# Patient Record
Sex: Female | Born: 1965
Health system: Southern US, Community
[De-identification: ages and names within clinical notes are randomized; demographics above are authoritative.]

## PROBLEM LIST (undated history)

## (undated) DIAGNOSIS — S62309A Unspecified fracture of unspecified metacarpal bone, initial encounter for closed fracture: Secondary | ICD-10-CM

## (undated) DIAGNOSIS — D509 Iron deficiency anemia, unspecified: Secondary | ICD-10-CM

## (undated) DIAGNOSIS — J019 Acute sinusitis, unspecified: Secondary | ICD-10-CM

## (undated) DIAGNOSIS — E785 Hyperlipidemia, unspecified: Secondary | ICD-10-CM

## (undated) DIAGNOSIS — S329XXA Fracture of unspecified parts of lumbosacral spine and pelvis, initial encounter for closed fracture: Secondary | ICD-10-CM

## (undated) HISTORY — DX: Hyperlipidemia, unspecified: E78.5

## (undated) HISTORY — DX: Acute sinusitis, unspecified: J01.90

## (undated) HISTORY — DX: Fracture of unspecified parts of lumbosacral spine and pelvis, initial encounter for closed fracture: S32.9XXA

## (undated) HISTORY — DX: Unspecified fracture of unspecified metacarpal bone, initial encounter for closed fracture: S62.309A

## (undated) HISTORY — DX: Iron deficiency anemia, unspecified: D50.9

## (undated) HISTORY — PX: OTHER SURGICAL HISTORY: SHX169

---

## 1999-03-26 ENCOUNTER — Encounter: Payer: Self-pay | Admitting: Obstetrics & Gynecology

## 1999-03-26 ENCOUNTER — Ambulatory Visit (HOSPITAL_COMMUNITY): Admission: RE | Admit: 1999-03-26 | Discharge: 1999-03-26 | Payer: Self-pay | Admitting: Obstetrics & Gynecology

## 1999-06-19 ENCOUNTER — Ambulatory Visit (HOSPITAL_COMMUNITY): Admission: RE | Admit: 1999-06-19 | Discharge: 1999-06-19 | Payer: Self-pay | Admitting: Ophthalmology

## 1999-06-19 ENCOUNTER — Encounter: Payer: Self-pay | Admitting: Ophthalmology

## 2000-04-19 ENCOUNTER — Encounter: Admission: RE | Admit: 2000-04-19 | Discharge: 2000-04-19 | Payer: Self-pay | Admitting: Obstetrics & Gynecology

## 2000-04-19 ENCOUNTER — Encounter: Payer: Self-pay | Admitting: Obstetrics & Gynecology

## 2001-03-16 ENCOUNTER — Other Ambulatory Visit: Admission: RE | Admit: 2001-03-16 | Discharge: 2001-03-16 | Payer: Self-pay | Admitting: Obstetrics and Gynecology

## 2002-06-13 ENCOUNTER — Other Ambulatory Visit: Admission: RE | Admit: 2002-06-13 | Discharge: 2002-06-13 | Payer: Self-pay | Admitting: Obstetrics and Gynecology

## 2002-09-14 ENCOUNTER — Emergency Department (HOSPITAL_COMMUNITY): Admission: EM | Admit: 2002-09-14 | Discharge: 2002-09-14 | Payer: Self-pay | Admitting: Emergency Medicine

## 2003-11-14 ENCOUNTER — Other Ambulatory Visit: Admission: RE | Admit: 2003-11-14 | Discharge: 2003-11-14 | Payer: Self-pay | Admitting: Obstetrics and Gynecology

## 2004-12-14 ENCOUNTER — Other Ambulatory Visit: Admission: RE | Admit: 2004-12-14 | Discharge: 2004-12-14 | Payer: Self-pay | Admitting: Obstetrics and Gynecology

## 2004-12-18 ENCOUNTER — Encounter: Admission: RE | Admit: 2004-12-18 | Discharge: 2004-12-18 | Payer: Self-pay | Admitting: Obstetrics and Gynecology

## 2005-10-20 ENCOUNTER — Emergency Department (HOSPITAL_COMMUNITY): Admission: EM | Admit: 2005-10-20 | Discharge: 2005-10-20 | Payer: Self-pay | Admitting: Emergency Medicine

## 2006-05-05 ENCOUNTER — Encounter: Admission: RE | Admit: 2006-05-05 | Discharge: 2006-05-05 | Payer: Self-pay | Admitting: Obstetrics and Gynecology

## 2006-05-19 ENCOUNTER — Encounter: Admission: RE | Admit: 2006-05-19 | Discharge: 2006-05-19 | Payer: Self-pay | Admitting: Obstetrics and Gynecology
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
## 2007-05-25 ENCOUNTER — Encounter: Admission: RE | Admit: 2007-05-25 | Discharge: 2007-05-25 | Payer: Self-pay | Admitting: Obstetrics and Gynecology

## 2007-11-29 ENCOUNTER — Ambulatory Visit: Payer: Self-pay | Admitting: Internal Medicine

## 2007-11-29 DIAGNOSIS — S329XXA Fracture of unspecified parts of lumbosacral spine and pelvis, initial encounter for closed fracture: Secondary | ICD-10-CM

## 2007-11-29 DIAGNOSIS — S62309A Unspecified fracture of unspecified metacarpal bone, initial encounter for closed fracture: Secondary | ICD-10-CM

## 2007-11-29 DIAGNOSIS — D509 Iron deficiency anemia, unspecified: Secondary | ICD-10-CM

## 2007-11-29 HISTORY — DX: Iron deficiency anemia, unspecified: D50.9

## 2007-11-29 HISTORY — DX: Unspecified fracture of unspecified metacarpal bone, initial encounter for closed fracture: S62.309A

## 2007-11-29 HISTORY — DX: Fracture of unspecified parts of lumbosacral spine and pelvis, initial encounter for closed fracture: S32.9XXA

## 2007-11-29 LAB — CONVERTED CEMR LAB
AST: 27 units/L (ref 0–37)
Albumin: 3.8 g/dL (ref 3.5–5.2)
Alkaline Phosphatase: 34 units/L — ABNORMAL LOW (ref 39–117)
Basophils Relative: 1.3 % — ABNORMAL HIGH (ref 0.0–1.0)
Bilirubin Urine: NEGATIVE
Bilirubin, Direct: 0.1 mg/dL (ref 0.0–0.3)
CO2: 27 meq/L (ref 19–32)
Calcium: 9.2 mg/dL (ref 8.4–10.5)
Chloride: 104 meq/L (ref 96–112)
Cholesterol: 248 mg/dL (ref 0–200)
Crystals: NEGATIVE
Eosinophils Absolute: 0.5 10*3/uL (ref 0.0–0.7)
Eosinophils Relative: 9.6 % — ABNORMAL HIGH (ref 0.0–5.0)
GFR calc non Af Amer: 64 mL/min
Glucose, Bld: 95 mg/dL (ref 70–99)
HCT: 33.6 % — ABNORMAL LOW (ref 36.0–46.0)
Hemoglobin: 10.8 g/dL — ABNORMAL LOW (ref 12.0–15.0)
MCV: 69.4 fL — ABNORMAL LOW (ref 78.0–100.0)
Monocytes Absolute: 0.3 10*3/uL (ref 0.1–1.0)
Monocytes Relative: 6.8 % (ref 3.0–12.0)
Mucus, UA: NEGATIVE
Nitrite: NEGATIVE
Platelets: 273 10*3/uL (ref 150–400)
RBC: 4.84 M/uL (ref 3.87–5.11)
Specific Gravity, Urine: <=1.005 (ref 1.000–1.03)
Squamous Epithelial / LPF: NEGATIVE /lpf
TSH: 0.8 microintl units/mL (ref 0.35–5.50)
Total Protein, Urine: NEGATIVE mg/dL
Triglycerides: 57 mg/dL (ref 0–149)
Urobilinogen, UA: 0.2 (ref 0.0–1.0)
pH: 6 (ref 5.0–8.0)

## 2008-02-13 ENCOUNTER — Telehealth (INDEPENDENT_AMBULATORY_CARE_PROVIDER_SITE_OTHER): Payer: Self-pay | Admitting: *Deleted

## 2008-02-14 ENCOUNTER — Encounter (INDEPENDENT_AMBULATORY_CARE_PROVIDER_SITE_OTHER): Payer: Self-pay | Admitting: *Deleted

## 2008-05-28 ENCOUNTER — Encounter: Admission: RE | Admit: 2008-05-28 | Discharge: 2008-05-28 | Payer: Self-pay | Admitting: Obstetrics and Gynecology

## 2008-10-12 ENCOUNTER — Ambulatory Visit: Payer: Self-pay | Admitting: Internal Medicine

## 2008-10-12 DIAGNOSIS — J019 Acute sinusitis, unspecified: Secondary | ICD-10-CM

## 2008-10-12 HISTORY — DX: Acute sinusitis, unspecified: J01.90

## 2008-12-17 ENCOUNTER — Ambulatory Visit: Payer: Self-pay | Admitting: Internal Medicine

## 2008-12-17 LAB — CONVERTED CEMR LAB
ALT: 16 units/L (ref 0–35)
AST: 23 units/L (ref 0–37)
Albumin: 4.1 g/dL (ref 3.5–5.2)
Alkaline Phosphatase: 39 units/L (ref 39–117)
BUN: 17 mg/dL (ref 6–23)
Basophils Absolute: 0.1 10*3/uL (ref 0.0–0.1)
Basophils Relative: 1.5 % (ref 0.0–3.0)
Bilirubin Urine: NEGATIVE
Bilirubin, Direct: 0 mg/dL (ref 0.0–0.3)
CO2: 30 meq/L (ref 19–32)
Calcium: 9.2 mg/dL (ref 8.4–10.5)
Chloride: 104 meq/L (ref 96–112)
Cholesterol: 229 mg/dL — ABNORMAL HIGH (ref 0–200)
Creatinine, Ser: 0.9 mg/dL (ref 0.4–1.2)
Direct LDL: 149.8 mg/dL
Eosinophils Absolute: 0.5 10*3/uL (ref 0.0–0.7)
Eosinophils Relative: 9.1 % — ABNORMAL HIGH (ref 0.0–5.0)
GFR calc non Af Amer: 87.15 mL/min (ref >60)
Glucose, Bld: 85 mg/dL (ref 70–99)
HCT: 33.5 % — ABNORMAL LOW (ref 36.0–46.0)
HDL: 68.1 mg/dL (ref >39.00)
Hemoglobin: 11.1 g/dL — ABNORMAL LOW (ref 12.0–15.0)
Ketones, ur: NEGATIVE mg/dL
Lymphocytes Relative: 30.2 % (ref 12.0–46.0)
Lymphs Abs: 1.5 10*3/uL (ref 0.7–4.0)
MCHC: 33 g/dL (ref 30.0–36.0)
MCV: 69.1 fL — ABNORMAL LOW (ref 78.0–100.0)
Monocytes Absolute: 0.3 10*3/uL (ref 0.1–1.0)
Monocytes Relative: 6.6 % (ref 3.0–12.0)
Neutro Abs: 2.7 10*3/uL (ref 1.4–7.7)
Neutrophils Relative %: 52.6 % (ref 43.0–77.0)
Nitrite: NEGATIVE
Platelets: 320 10*3/uL (ref 150.0–400.0)
Potassium: 4.3 meq/L (ref 3.5–5.1)
RBC: 4.85 M/uL (ref 3.87–5.11)
RDW: 15.2 % — ABNORMAL HIGH (ref 11.5–14.6)
Sodium: 141 meq/L (ref 135–145)
Specific Gravity, Urine: <=1.005 (ref 1.000–1.030)
TSH: 0.95 microintl units/mL (ref 0.35–5.50)
Total Bilirubin: 0.7 mg/dL (ref 0.3–1.2)
Total CHOL/HDL Ratio: 3
Total Protein, Urine: NEGATIVE mg/dL
Total Protein: 7.6 g/dL (ref 6.0–8.3)
Triglycerides: 62 mg/dL (ref 0.0–149.0)
Urine Glucose: NEGATIVE mg/dL
Urobilinogen, UA: 0.2 (ref 0.0–1.0)
VLDL: 12.4 mg/dL (ref 0.0–40.0)
WBC: 5.1 10*3/uL (ref 4.5–10.5)
pH: 5.5 (ref 5.0–8.0)

## 2009-05-01 LAB — HM MAMMOGRAPHY: HM Mammogram: NORMAL

## 2009-06-27 ENCOUNTER — Encounter: Admission: RE | Admit: 2009-06-27 | Discharge: 2009-06-27 | Payer: Self-pay | Admitting: Obstetrics and Gynecology

## 2010-01-06 ENCOUNTER — Encounter: Payer: Self-pay | Admitting: Internal Medicine

## 2010-01-06 ENCOUNTER — Ambulatory Visit: Payer: Self-pay | Admitting: Internal Medicine

## 2010-01-06 DIAGNOSIS — E785 Hyperlipidemia, unspecified: Secondary | ICD-10-CM

## 2010-01-06 HISTORY — DX: Hyperlipidemia, unspecified: E78.5

## 2010-01-07 LAB — CONVERTED CEMR LAB
Albumin: 4 g/dL (ref 3.5–5.2)
BUN: 17 mg/dL (ref 6–23)
Basophils Absolute: 0.1 10*3/uL (ref 0.0–0.1)
CO2: 27 meq/L (ref 19–32)
Calcium: 9.2 mg/dL (ref 8.4–10.5)
Cholesterol: 270 mg/dL — ABNORMAL HIGH (ref 0–200)
Eosinophils Absolute: 0.5 10*3/uL (ref 0.0–0.7)
Folate: >20 ng/mL
GFR calc non Af Amer: 82.49 mL/min (ref >60)
Glucose, Bld: 84 mg/dL (ref 70–99)
HCT: 34.3 % — ABNORMAL LOW (ref 36.0–46.0)
HDL: 76.4 mg/dL (ref >39.00)
Hemoglobin: 11.3 g/dL — ABNORMAL LOW (ref 12.0–15.0)
Lymphocytes Relative: 25.3 % (ref 12.0–46.0)
Lymphs Abs: 1.1 10*3/uL (ref 0.7–4.0)
MCHC: 32.9 g/dL (ref 30.0–36.0)
Monocytes Relative: 7.6 % (ref 3.0–12.0)
Neutro Abs: 2.2 10*3/uL (ref 1.4–7.7)
Nitrite: NEGATIVE
Platelets: 322 10*3/uL (ref 150.0–400.0)
RDW: 16.9 % — ABNORMAL HIGH (ref 11.5–14.6)
Sodium: 142 meq/L (ref 135–145)
Specific Gravity, Urine: 1.025 (ref 1.000–1.030)
Total Bilirubin: 0.6 mg/dL (ref 0.3–1.2)
Total CHOL/HDL Ratio: 4
Total Protein, Urine: NEGATIVE mg/dL
VLDL: 7.8 mg/dL (ref 0.0–40.0)
pH: 5.5 (ref 5.0–8.0)

## 2010-01-17 ENCOUNTER — Ambulatory Visit: Payer: Self-pay | Admitting: Internal Medicine

## 2010-01-17 LAB — CONVERTED CEMR LAB
Bilirubin Urine: NEGATIVE
Hemoglobin, Urine: NEGATIVE
Ketones, ur: NEGATIVE mg/dL
Total Protein, Urine: NEGATIVE mg/dL
Urine Glucose: NEGATIVE mg/dL

## 2010-06-17 ENCOUNTER — Ambulatory Visit
Admission: RE | Admit: 2010-06-17 | Discharge: 2010-06-17 | Payer: Self-pay | Source: Home / Self Care | Attending: Internal Medicine | Admitting: Internal Medicine

## 2010-06-17 DIAGNOSIS — N390 Urinary tract infection, site not specified: Secondary | ICD-10-CM | POA: Insufficient documentation

## 2010-06-17 LAB — CONVERTED CEMR LAB
Bilirubin Urine: NEGATIVE
Blood in Urine, dipstick: NEGATIVE
Nitrite: NEGATIVE
Protein, U semiquant: NEGATIVE
WBC Urine, dipstick: NEGATIVE

## 2010-06-18 ENCOUNTER — Encounter: Payer: Self-pay | Admitting: Internal Medicine

## 2010-06-22 ENCOUNTER — Encounter: Payer: Self-pay | Admitting: Obstetrics and Gynecology
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
## 2010-07-29 ENCOUNTER — Other Ambulatory Visit: Payer: Self-pay | Admitting: Obstetrics and Gynecology

## 2010-07-29 DIAGNOSIS — Z1231 Encounter for screening mammogram for malignant neoplasm of breast: Secondary | ICD-10-CM

## 2010-08-05 ENCOUNTER — Ambulatory Visit
Admission: RE | Admit: 2010-08-05 | Discharge: 2010-08-05 | Disposition: A | Discharge status: Home / Self Care | Payer: Commercial Managed Care - PPO | Source: Ambulatory Visit | Attending: Obstetrics and Gynecology | Admitting: Obstetrics and Gynecology

## 2010-08-05 DIAGNOSIS — Z1231 Encounter for screening mammogram for malignant neoplasm of breast: Secondary | ICD-10-CM

## 2011-04-02 ENCOUNTER — Encounter: Payer: Self-pay | Admitting: Internal Medicine

## 2011-04-03 ENCOUNTER — Encounter: Payer: Self-pay | Admitting: Internal Medicine

## 2011-04-03 DIAGNOSIS — Z0000 Encounter for general adult medical examination without abnormal findings: Secondary | ICD-10-CM | POA: Insufficient documentation

## 2011-04-10 ENCOUNTER — Encounter: Payer: Self-pay | Admitting: Internal Medicine

## 2011-04-10 ENCOUNTER — Other Ambulatory Visit (INDEPENDENT_AMBULATORY_CARE_PROVIDER_SITE_OTHER): Payer: Commercial Managed Care - PPO

## 2011-04-10 ENCOUNTER — Ambulatory Visit (INDEPENDENT_AMBULATORY_CARE_PROVIDER_SITE_OTHER): Payer: Commercial Managed Care - PPO | Admitting: Internal Medicine

## 2011-04-10 VITALS — BP 118/72 | HR 71 | Temp 98.1°F | Ht 71.0 in | Wt 182.0 lb

## 2011-04-10 DIAGNOSIS — Z0000 Encounter for general adult medical examination without abnormal findings: Secondary | ICD-10-CM

## 2011-04-10 DIAGNOSIS — M25562 Pain in left knee: Secondary | ICD-10-CM

## 2011-04-10 DIAGNOSIS — D509 Iron deficiency anemia, unspecified: Secondary | ICD-10-CM

## 2011-04-10 DIAGNOSIS — M1712 Unilateral primary osteoarthritis, left knee: Secondary | ICD-10-CM | POA: Insufficient documentation

## 2011-04-10 DIAGNOSIS — E785 Hyperlipidemia, unspecified: Secondary | ICD-10-CM

## 2011-04-10 DIAGNOSIS — M25569 Pain in unspecified knee: Secondary | ICD-10-CM

## 2011-04-10 LAB — URINALYSIS, ROUTINE W REFLEX MICROSCOPIC
Bilirubin Urine: NEGATIVE
Leukocytes, UA: NEGATIVE
Nitrite: NEGATIVE
Specific Gravity, Urine: 1.01 (ref 1.000–1.030)
pH: 6 (ref 5.0–8.0)

## 2011-04-10 LAB — CBC WITH DIFFERENTIAL/PLATELET
Eosinophils Relative: 8.9 % — ABNORMAL HIGH (ref 0.0–5.0)
Monocytes Absolute: 0.5 10*3/uL (ref 0.1–1.0)
Monocytes Relative: 7.4 % (ref 3.0–12.0)
Neutrophils Relative %: 55.5 % (ref 43.0–77.0)
Platelets: 355 10*3/uL (ref 150.0–400.0)
WBC: 6.2 10*3/uL (ref 4.5–10.5)

## 2011-04-10 LAB — HEPATIC FUNCTION PANEL
ALT: 18 U/L (ref 0–35)
AST: 23 U/L (ref 0–37)
Albumin: 3.8 g/dL (ref 3.5–5.2)

## 2011-04-10 LAB — LIPID PANEL
Cholesterol: 186 mg/dL (ref 0–200)
VLDL: 14.8 mg/dL (ref 0.0–40.0)

## 2011-04-10 LAB — BASIC METABOLIC PANEL
BUN: 15 mg/dL (ref 6–23)
Chloride: 103 mEq/L (ref 96–112)
Glucose, Bld: 81 mg/dL (ref 70–99)
Potassium: 3.8 mEq/L (ref 3.5–5.1)

## 2011-04-10 LAB — TSH: TSH: 0.73 u[IU]/mL (ref 0.35–5.50)

## 2011-04-10 NOTE — Patient Instructions (Signed)
Continue all other medications as before Please go to LAB in the Basement for the blood and/or urine tests to be done today Please call the phone number [phone] (the [employer] System) for results of testing in 2-3 days;  When calling, simply dial the number, and when prompted enter the MRN number above (the Medical Record Number) and the # key, then the message should start. Please call if you change your mind about medication for the high cholesterol Please return in 1 year for your yearly visit, or sooner if needed, with Lab testing done 3-5 days before

## 2011-04-11 ENCOUNTER — Encounter: Payer: Self-pay | Admitting: Internal Medicine

## 2011-04-11 NOTE — Assessment & Plan Note (Signed)
?   Mild cartilage tear;  Overall mild, decliens ortho eval at this time, tylenol prn

## 2011-04-11 NOTE — Assessment & Plan Note (Addendum)
With persistent elevation as per family - high lilkely genetic but essent neg FH for stroke/MI per pt; I suggested statin tx but she declines  Lab Results  Component Value Date   CHOL 270* 01/06/2010   HDL 76.40 01/06/2010   LDLDIRECT 181.9 01/06/2010   TRIG 39.0 01/06/2010   CHOLHDL 4 01/06/2010

## 2011-04-11 NOTE — Assessment & Plan Note (Signed)
stable overall by hx and exam, most recent data reviewed with pt, and pt to continue medical treatment as before , f/u labs next visit

## 2011-04-11 NOTE — Progress Notes (Signed)
Subjective:    Patient ID: Ruth Medina, female    DOB: Jul 16, 1965, 45 y.o.   MRN: [id number]  HPI Here for wellness and f/u;  Overall doing ok;  Pt denies CP, worsening SOB, DOE, wheezing, orthopnea, PND, worsening LE edema, palpitations, dizziness or syncope.  Pt denies neurological change such as new Headache, facial or extremity weakness.  Pt denies polydipsia, polyuria, or low sugar symptoms. Pt states overall good compliance with treatment and medications, good tolerability, and trying to follow lower cholesterol diet.  Pt denies worsening depressive symptoms, suicidal ideation or panic. No fever, wt loss, night sweats, loss of appetite, or other constitutional symptoms.  Pt states good ability with ADL's, low fall risk, home safety reviewed and adequate, no significant changes in hearing or vision, and occasionally active with exercise. Does have recurreing  1 yr left knee medial effusion and discomfort, worse to walking up inclines only, after episodes of exercise at the gym. No giveaway or falls Past Medical History  Diagnosis Date  . 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 FRACTURE, HAND, LEFT 11/29/2007  . FRACTURE, PELVIS 11/29/2007  . HYPERLIPIDEMIA 01/06/2010  . SINUSITIS- ACUTE-NOS 10/12/2008   Past Surgical History  Procedure Date  . Left hand surgery     Dr. Eulah Pont    reports that she has never smoked. She does not have any smokeless tobacco history on file. She reports that she drinks alcohol. She reports that she does not use illicit drugs. family history includes Alcohol abuse in her father; Breast cancer in her mother; and Hypertension in her father and mother. No Known Allergies Current Outpatient Prescriptions on File Prior to Visit  Medication Sig Dispense Refill  . ferrous sulfate 325 (65 FE) MG tablet Take 325 mg by mouth daily with breakfast.        . Multiple Vitamin (MULTIVITAMIN) tablet Take 1 tablet by mouth daily.        . fluconazole (DIFLUCAN) 100 MG tablet Take  100 mg by mouth daily.         Review of Systems Review of Systems  Constitutional: Negative for diaphoresis, activity change, appetite change and unexpected weight change.  HENT: Negative for hearing loss, ear pain, facial swelling, mouth sores and neck stiffness.   Eyes: Negative for pain, redness and visual disturbance.  Respiratory: Negative for shortness of breath and wheezing.   Cardiovascular: Negative for chest pain and palpitations.  Gastrointestinal: Negative for diarrhea, blood in stool, abdominal distention and rectal pain.  Genitourinary: Negative for hematuria, flank pain and decreased urine volume.  Musculoskeletal: Negative for myalgias and joint swelling.  Skin: Negative for color change and wound.  Neurological: Negative for syncope and numbness.  Hematological: Negative for adenopathy.  Psychiatric/Behavioral: Negative for hallucinations, self-injury, decreased concentration and agitation.      Objective:   Physical Exam BP 118/72  Pulse 71  Temp(Src) 98.1 F (36.7 C) (Oral)  Ht 5\' 11"  (1.803 m)  Wt 182 lb (82.555 kg)  BMI 25.38 kg/m2  SpO2 95%  LMP 04/04/2011 Physical Exam  VS noted Constitutional: Pt is oriented to person, place, and time. Appears well-developed and well-nourished.  HENT:  Head: Normocephalic and atraumatic.  Right Ear: External ear normal.  Left Ear: External ear normal.  Nose: Nose normal.  Mouth/Throat: Oropharynx is clear and moist.  Eyes: Conjunctivae and EOM are normal. Pupils are equal, round, and reactive to light.  Neck: Normal range of motion. Neck supple. No JVD present. No tracheal deviation present.  Cardiovascular: Normal  rate, regular rhythm, normal heart sounds and intact distal pulses.   Pulmonary/Chest: Effort normal and breath sounds normal.  Abdominal: Soft. Bowel sounds are normal. There is no tenderness.  Musculoskeletal: Normal range of motion. Exhibits no edema.  Lymphadenopathy:  Has no cervical adenopathy.    Neurological: Pt is alert and oriented to person, place, and time. Pt has normal reflexes. No cranial nerve deficit.  Skin: Skin is warm and dry. No rash noted.  Psychiatric:  Has  normal mood and affect. Behavior is normal.  Left knee with mild anteromedial effusion, mild tender, neg lochmans    Assessment & Plan:

## 2011-04-14 LAB — IBC PANEL
Iron: 32 ug/dL — ABNORMAL LOW (ref 42–145)
Saturation Ratios: 8.2 % — ABNORMAL LOW (ref 20.0–50.0)

## 2011-06-16 ENCOUNTER — Ambulatory Visit: Payer: Commercial Managed Care - PPO | Admitting: Internal Medicine

## 2011-08-13 ENCOUNTER — Encounter (INDEPENDENT_AMBULATORY_CARE_PROVIDER_SITE_OTHER): Payer: Commercial Managed Care - PPO | Admitting: Obstetrics and Gynecology

## 2011-08-13 ENCOUNTER — Encounter (INDEPENDENT_AMBULATORY_CARE_PROVIDER_SITE_OTHER): Payer: Commercial Managed Care - PPO

## 2011-08-13 DIAGNOSIS — N949 Unspecified condition associated with female genital organs and menstrual cycle: Secondary | ICD-10-CM

## 2011-10-14 ENCOUNTER — Other Ambulatory Visit: Payer: Self-pay | Admitting: Obstetrics and Gynecology

## 2011-10-14 DIAGNOSIS — Z1231 Encounter for screening mammogram for malignant neoplasm of breast: Secondary | ICD-10-CM

## 2011-10-19 ENCOUNTER — Ambulatory Visit
Admission: RE | Admit: 2011-10-19 | Discharge: 2011-10-19 | Disposition: A | Discharge status: Home / Self Care | Payer: Commercial Managed Care - PPO | Source: Ambulatory Visit | Attending: Obstetrics and Gynecology | Admitting: Obstetrics and Gynecology

## 2011-10-19 DIAGNOSIS — Z1231 Encounter for screening mammogram for malignant neoplasm of breast: Secondary | ICD-10-CM

## 2011-12-22 ENCOUNTER — Encounter: Payer: Self-pay | Admitting: Obstetrics and Gynecology

## 2012-01-21 ENCOUNTER — Telehealth: Payer: Self-pay | Admitting: Internal Medicine

## 2012-01-21 NOTE — Telephone Encounter (Signed)
LMP: 01/07/12 Caller: Salley Slaughter; Patient Name: Ruth Medina; PCP: Oliver Barre; Best Callback Phone Number: [phone].Jendayi has been feeling stressed with changes with job- when she goes home symptoms start. She feels anxious and  is having trouble sleeping. Triage and Care advice per Anxiety Protocol and appnt advised within 24 hours for "feeling overwhelmed and unable to calm down". Scheduled for 01/22/12  @ 1045 with Dr. Jonny Ruiz.

## 2012-01-22 ENCOUNTER — Ambulatory Visit (INDEPENDENT_AMBULATORY_CARE_PROVIDER_SITE_OTHER): Payer: Commercial Managed Care - PPO | Admitting: Internal Medicine

## 2012-01-22 ENCOUNTER — Encounter: Payer: Self-pay | Admitting: Internal Medicine

## 2012-01-22 VITALS — BP 132/90 | HR 88 | Temp 97.5°F | Ht 71.0 in | Wt 180.1 lb

## 2012-01-22 DIAGNOSIS — F419 Anxiety disorder, unspecified: Secondary | ICD-10-CM | POA: Insufficient documentation

## 2012-01-22 DIAGNOSIS — F411 Generalized anxiety disorder: Secondary | ICD-10-CM

## 2012-01-22 MED ORDER — CLONAZEPAM 0.5 MG PO TABS: ORAL_TABLET | ORAL | Status: DC

## 2012-01-22 NOTE — Patient Instructions (Addendum)
Take all new medications as prescribed Continue all other medications as before  

## 2012-01-23 ENCOUNTER — Encounter: Payer: Self-pay | Admitting: Internal Medicine

## 2012-01-23 NOTE — Assessment & Plan Note (Signed)
Mild to mod worsening situational, for klonopin bid prn,  to f/u any worsening symptoms or concerns, declines counseling at this time

## 2012-01-23 NOTE — Progress Notes (Signed)
  Subjective:    Patient ID: Ruth Medina, female    DOB: December 19, 1965, 46 y.o.   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  She survived the first wave of recent personell layoffs, but is worried about the next 2 waves that they have been told are coming.  Has caused increase in sleep difficulty, though does not necessarily want medication for that.  Denies substance or ETOH use/abuse.  No prior hx of same in the past.  Finds the whole situation very difficult to deal with at her age, feels overwhelmed. Past Medical History  Diagnosis Date  . 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 FRACTURE, HAND, LEFT 11/29/2007  . FRACTURE, PELVIS 11/29/2007  . HYPERLIPIDEMIA 01/06/2010  . SINUSITIS- ACUTE-NOS 10/12/2008   Past Surgical History  Procedure Date  . Left hand surgery     Dr. Eulah Pont    reports that she has never smoked. She does not have any smokeless tobacco history on file. She reports that she drinks alcohol. She reports that she does not use illicit drugs. family history includes Alcohol abuse in her father; Breast cancer in her mother; and Hypertension in her father and mother. No Known Allergies Current Outpatient Prescriptions on File Prior to Visit  Medication Sig Dispense Refill  . Multiple Vitamin (MULTIVITAMIN) tablet Take 1 tablet by mouth daily.        . clonazePAM (KLONOPIN) 0.5 MG tablet 1 tab by mouth in the am and 1-2 tabs in the pm as needed for stress and sleep  90 tablet  1   Review of Systems All otherwise neg per pt    Objective:   Physical Exam BP 132/90  Pulse 88  Temp 97.5 F (36.4 C) (Oral)  Ht 5\' 11"  (1.803 m)  Wt 180 lb 2  oz (81.704 kg)  BMI 25.12 kg/m2  SpO2 99% Physical Exam  VS noted Constitutional: Pt appears well-developed and well-nourished.  HENT: Head: Normocephalic.  Right Ear: External ear normal.  Left Ear: External ear normal.  Eyes: Conjunctivae and EOM are normal. Pupils are equal, round, and reactive to light.  Neck: Normal range of motion. Neck supple.  Cardiovascular: Normal rate and regular rhythm.   Pulmonary/Chest: Effort normal and breath sounds normal.  Neurological: Pt is alert. Not confused Skin: Skin is warm. No erythema.  Psychiatric: Pt behavior is normal. Thought content normal. 1+ nervous, not depressed affect    Assessment & Plan:

## 2012-04-22 ENCOUNTER — Encounter: Payer: Self-pay | Admitting: Internal Medicine

## 2012-04-22 ENCOUNTER — Ambulatory Visit (INDEPENDENT_AMBULATORY_CARE_PROVIDER_SITE_OTHER): Payer: 59 | Admitting: Internal Medicine

## 2012-04-22 VITALS — BP 130/72 | HR 85 | Temp 97.2°F | Ht 71.0 in | Wt 185.0 lb

## 2012-04-22 DIAGNOSIS — Z0000 Encounter for general adult medical examination without abnormal findings: Secondary | ICD-10-CM

## 2012-04-22 DIAGNOSIS — F419 Anxiety disorder, unspecified: Secondary | ICD-10-CM

## 2012-04-22 DIAGNOSIS — F411 Generalized anxiety disorder: Secondary | ICD-10-CM

## 2012-04-22 DIAGNOSIS — M25569 Pain in unspecified knee: Secondary | ICD-10-CM

## 2012-04-22 DIAGNOSIS — M25562 Pain in left knee: Secondary | ICD-10-CM

## 2012-04-22 MED ORDER — CLONAZEPAM 0.5 MG PO TABS: ORAL_TABLET | ORAL | Status: DC

## 2012-04-22 MED ORDER — DICLOFENAC SODIUM 75 MG PO TBEC: 75.0000 mg | DELAYED_RELEASE_TABLET | Freq: Two times a day (BID) | ORAL | Status: DC | PRN

## 2012-04-22 NOTE — Patient Instructions (Addendum)
Take all new medications as prescribed - the anti-inflammatory (sent to the pharmacy) Continue all other medications as before You are given the refills today - the klonopin Please have the pharmacy call with any other refills you may need in the future Please continue your efforts at being more active, low cholesterol diet, and weight control. Thank you for enrolling in MyChart. Please follow the instructions below to securely access your online medical record. MyChart allows you to send messages to your doctor, view your test results, renew your prescriptions, schedule appointments, and more. To Log into MyChart, please go to [email or web address], and your Username is: [email or web address] Please return in 1 year for your yearly visit, or sooner if needed, with Lab testing done 3-5 days before

## 2012-04-22 NOTE — Assessment & Plan Note (Signed)
stable overall by hx and exam, , and pt to continue medical treatment as before   

## 2012-04-22 NOTE — Assessment & Plan Note (Addendum)
Overall doing well, age appropriate education and counseling updated, referrals for preventative services and immunizations addressed, dietary and smoking counseling addressed, most recent labs and ECG reviewed.  I have personally reviewed and have noted: 1) the patient's medical and social history 2) The pt's use of alcohol, tobacco, and illicit drugs 3) The patient's current medications and supplements 4) Functional ability including ADL's, fall risk, home safety risk, hearing and visual impairment 5) Diet and physical activities 6) Evidence for depression or mood disorder 7) The patient's height, weight, and BMI have been recorded in the chart I have made referrals, and provided counseling and education based on review of the above Declines labs/ecg today

## 2012-04-22 NOTE — Assessment & Plan Note (Signed)
Ok for voltaren prn,  to f/u any worsening symptoms or concerns

## 2012-04-22 NOTE — Progress Notes (Signed)
Subjective:    Patient ID: Ruth Medina, female    DOB: [birth year], 46 y.o.   MRN: [id number]  HPI  Here for wellness and f/u;  Overall doing ok;  Pt denies CP, worsening SOB, DOE, wheezing, orthopnea, PND, worsening LE edema, palpitations, dizziness or syncope.  Pt denies neurological change such as new Headache, facial or extremity weakness.  Pt denies polydipsia, polyuria, or low sugar symptoms. Pt states overall good compliance with treatment and medications, good tolerability, and trying to follow lower cholesterol diet.  Pt denies worsening depressive symptoms, suicidal ideation or panic. No fever, wt loss, night sweats, loss of appetite, or other constitutional symptoms.  Pt states good ability with ADL's, low fall risk, home safety reviewed and adequate, no significant changes in hearing or vision, and occasionally active with exercise.  Has had mild intermittent left knee pain without swelling, giveaway or falls.  Voltaren helps.  Denies worsening depressive symptoms, suicidal ideation, or panic, though has ongoing anxiety, stable recently on current meds Past Medical History  Diagnosis Date  . 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 FRACTURE, HAND, LEFT 11/29/2007  . FRACTURE, PELVIS 11/29/2007  . HYPERLIPIDEMIA 01/06/2010  . SINUSITIS- ACUTE-NOS 10/12/2008   Past Surgical History  Procedure Date  . Left hand surgery     Dr. Eulah Pont    reports that she has never smoked. She does not have any smokeless tobacco history on file. She reports that she drinks alcohol. She reports that she does not use illicit drugs. family history includes Alcohol abuse in her father; Breast cancer in her mother; and Hypertension in her father and mother. No Known Allergies Current Outpatient Prescriptions on File Prior to Visit  Medication Sig Dispense Refill  . ferrous sulfate 325 (65 FE) MG tablet Take 325 mg by mouth daily with breakfast.      . Multiple Vitamin (MULTIVITAMIN) tablet Take 1 tablet by mouth  daily.        . [DISCONTINUED] clonazePAM (KLONOPIN) 0.5 MG tablet 1 tab by mouth in the am and 1-2 tabs in the pm as needed for stress and sleep  90 tablet  1   Review of Systems Review of Systems  Constitutional: Negative for diaphoresis, activity change, appetite change and unexpected weight change.  HENT: Negative for hearing loss, ear pain, facial swelling, mouth sores and neck stiffness.   Eyes: Negative for pain, redness and visual disturbance.  Respiratory: Negative for shortness of breath and wheezing.   Cardiovascular: Negative for chest pain and palpitations.  Gastrointestinal: Negative for diarrhea, blood in stool, abdominal distention and rectal pain.  Genitourinary: Negative for hematuria, flank pain and decreased urine volume.  Musculoskeletal: Negative for myalgias and joint swelling.  Skin: Negative for color change and wound.  Neurological: Negative for syncope and numbness.  Hematological: Negative for adenopathy.  Psychiatric/Behavioral: Negative for hallucinations, self-injury, decreased concentration and agitation.      Objective:   Physical Exam BP 130/72  Pulse 85  Temp 97.2 F (36.2 C) (Oral)  Ht 5\' 11"  (1.803 m)  Wt 185 lb (83.915 kg)  BMI 25.80 kg/m2  SpO2 99% Physical Exam  VS noted Constitutional: Pt is oriented to person, place, and time. Appears well-developed and well-nourished.  HENT:  Head: Normocephalic and atraumatic.  Right Ear: External ear normal.  Left Ear: External ear normal.  Nose: Nose normal.  Mouth/Throat: Oropharynx is clear and moist.  Eyes: Conjunctivae and EOM are normal. Pupils are equal, round, and reactive to light.  Neck: Normal range  of motion. Neck supple. No JVD present. No tracheal deviation present.  Cardiovascular: Normal rate, regular rhythm, normal heart sounds and intact distal pulses.   Pulmonary/Chest: Effort normal and breath sounds normal.  Abdominal: Soft. Bowel sounds are normal. There is no tenderness.    Musculoskeletal: Normal range of motion. Exhibits no edema.  Lymphadenopathy:  Has no cervical adenopathy.  Neurological: Pt is alert and oriented to person, place, and time. Pt has normal reflexes. No cranial nerve deficit.  Skin: Skin is warm and dry. No rash noted.  Psychiatric:  Has  normal mood and affect. Behavior is normal. mild nervous Left knee arthritic changes, no effusion, FROM, NT, mild crepitus    Assessment & Plan:

## 2012-06-05 ENCOUNTER — Encounter (HOSPITAL_COMMUNITY): Payer: Self-pay | Admitting: *Deleted

## 2012-06-05 ENCOUNTER — Inpatient Hospital Stay (HOSPITAL_COMMUNITY)
Admission: AD | Admit: 2012-06-05 | Discharge: 2012-06-05 | Disposition: A | Discharge status: Hospice | Payer: 59 | Source: Ambulatory Visit | Attending: Obstetrics and Gynecology | Admitting: Obstetrics and Gynecology

## 2012-06-05 DIAGNOSIS — N920 Excessive and frequent menstruation with regular cycle: Secondary | ICD-10-CM | POA: Diagnosis present

## 2012-06-05 DIAGNOSIS — N949 Unspecified condition associated with female genital organs and menstrual cycle: Secondary | ICD-10-CM | POA: Insufficient documentation

## 2012-06-05 DIAGNOSIS — N938 Other specified abnormal uterine and vaginal bleeding: Secondary | ICD-10-CM | POA: Insufficient documentation

## 2012-06-05 LAB — CBC
Hemoglobin: 11.8 g/dL — ABNORMAL LOW (ref 12.0–15.0)
MCH: 22.2 pg — ABNORMAL LOW (ref 26.0–34.0)
MCHC: 31.5 g/dL (ref 30.0–36.0)
MCV: 70.5 fL — ABNORMAL LOW (ref 78.0–100.0)

## 2012-06-05 LAB — TSH: TSH: 0.799 u[IU]/mL (ref 0.350–4.500)

## 2012-06-05 NOTE — MAU Note (Signed)
Vaginal bleeding since 12/23, period usually lasts 3 days.  C/O menstrual cramps, breast soreness, HA's.

## 2012-06-05 NOTE — MAU Note (Signed)
Had a menstrual cycle every month and this one started the 23rd and has continued to now.  Says the flow is light,  Clots when toileting.

## 2012-06-05 NOTE — MAU Provider Note (Signed)
History   47 yo G0 presented unannounced c/o onset of menses on 12/23, with continued bleeding since then.  Cycle was expected around that time, but usually only lasts 3 days.  Bleeding is generally a small amount, but then sporadically passes a moderate-sized clot when voiding.  Mild lower abdominal pain noted, but denies fever, urinary sx, discharge, or any other sx.  Sexually active, uses diaphragm.  Per EPIC information, had sonohystogram in March--patient reports done due to lower abdominal pain, but no hx of DUB or IM bleeding.  Patient Active Problem List  Diagnosis  . HYPERLIPIDEMIA  . ANEMIA-IRON DEFICIENCY  . FRACTURE, PELVIS  . FRACTURE, HAND, LEFT  . Preventative health care  . Degenerative joint disease of left knee  . 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   Chief Complaint  Patient presents with  . Vaginal Bleeding     OB History    Grav Para Term Preterm Abortions TAB SAB Ect Mult Living   0               Past Medical History  Diagnosis Date  . 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 FRACTURE, HAND, LEFT 11/29/2007  . FRACTURE, PELVIS 11/29/2007  . HYPERLIPIDEMIA 01/06/2010  . SINUSITIS- ACUTE-NOS 10/12/2008    Past Surgical History  Procedure Date  . Left hand surgery     Dr. Eulah Pont  . Aspiration of left knee     Family History  Problem Relation Age of Onset  . Breast cancer Mother   . Hypertension Mother   . Alcohol abuse Father   . Hypertension Father     History  Substance Use Topics  . Smoking status: Never Smoker   . Smokeless tobacco: Not on file  . Alcohol Use: Yes    Allergies: No Known Allergies  Prescriptions prior to admission  Medication Sig Dispense Refill  . diclofenac (VOLTAREN) 75 MG EC tablet Take 1 tablet (75 mg total) by mouth 2 (two) times daily as needed.  60 tablet  5  . ferrous sulfate 325 (65 FE) MG tablet Take 325 mg by mouth daily with breakfast.      . Multiple Vitamin (MULTIVITAMIN) tablet Take 1  tablet by mouth daily.        . clonazePAM (KLONOPIN) 0.5 MG tablet 1 tab by mouth in the am and 1-2 tabs in the pm as needed for stress and sleep  90 tablet  2     Physical Exam   Blood pressure 138/87, pulse 80, temperature 97.7 F (36.5 C), temperature source Oral, resp. rate 18, height 5\' 11"  (1.803 m), weight 185 lb 3.2 oz (84.006 kg), last menstrual period 05/23/2012.  Chest clear Abd soft, NT Pelvic--small amount menstrual flow in vault, no clots noted, minimal active bleeding at present No CMT noted.  No other d/c in vault.  Uterus small, NT, no irregularities of shape. Ext WNL  Results for orders placed during the hospital encounter of 06/05/12 (from the past 24 hour(s))  POCT PREGNANCY, URINE     Status: Normal   Collection Time   06/05/12  3:04 PM      Component Value Range   Preg Test, Ur NEGATIVE  NEGATIVE   Consulted with Dr. Stefano Gaul. Will check CBC (add TSH), and do cultures. If CBC stable (>4), will anticipate d/c home with plan for f/u with Dr. Pennie Rushing. Suggested Motrin 600 mg po q 6 hours x 24-48 hours for DUB treatment. Will plan office to  call patient tomorrow to schedule f/u with [facility].  Patient agreeable with plan.  Nigel Bridgeman, CNM 06/05/12 4p  ED Course     Nigel Bridgeman CNM, [place] 06/05/2012 3:17 PM   Addendum: Hgb 11.8 D/C'd home with bleeding precautions. Office will f/u with patient this week to schedule f/u with Dr. Pennie Rushing. EPIC note to Dr. Pennie Rushing to inform her of patient status.  Nigel Bridgeman, CNM 06/05/12 4:05p

## 2012-06-06 LAB — GC/CHLAMYDIA PROBE AMP: CT Probe RNA: NEGATIVE

## 2012-06-07 ENCOUNTER — Other Ambulatory Visit: Payer: Self-pay

## 2012-06-07 DIAGNOSIS — N926 Irregular menstruation, unspecified: Secondary | ICD-10-CM

## 2012-06-07 MED ORDER — MISOPROSTOL 200 MCG PO TABS: ORAL_TABLET | ORAL | Status: DC

## 2012-06-29 ENCOUNTER — Ambulatory Visit: Payer: Commercial Managed Care - PPO | Admitting: Obstetrics and Gynecology

## 2012-06-29 ENCOUNTER — Ambulatory Visit: Payer: Commercial Managed Care - PPO

## 2012-06-29 ENCOUNTER — Encounter: Payer: Self-pay | Admitting: Obstetrics and Gynecology

## 2012-06-29 ENCOUNTER — Other Ambulatory Visit: Payer: Self-pay | Admitting: Obstetrics and Gynecology

## 2012-06-29 VITALS — BP 130/70 | Wt 186.0 lb

## 2012-06-29 DIAGNOSIS — N920 Excessive and frequent menstruation with regular cycle: Secondary | ICD-10-CM

## 2012-06-29 DIAGNOSIS — IMO0002 Reserved for concepts with insufficient information to code with codable children: Secondary | ICD-10-CM

## 2012-06-29 DIAGNOSIS — N926 Irregular menstruation, unspecified: Secondary | ICD-10-CM

## 2012-06-29 DIAGNOSIS — N83209 Unspecified ovarian cyst, unspecified side: Secondary | ICD-10-CM | POA: Insufficient documentation

## 2012-06-29 DIAGNOSIS — N83201 Unspecified ovarian cyst, right side: Secondary | ICD-10-CM

## 2012-06-29 DIAGNOSIS — N9489 Other specified conditions associated with female genital organs and menstrual cycle: Secondary | ICD-10-CM

## 2012-06-29 NOTE — Progress Notes (Signed)
GYN PROBLEM VISIT  Ms. Ruth Medina is a 47 y.o. year old female,G0P0, who presents for a problem visit.   Subjective: She was seen 06/05/12 in [facility] MAU for a menstrual cycle which was much heavier and longer than normal.  Hgb was stable and she presents today for [facility].  Objective:  LMP 05/23/2012   External genitalia: normal general appearance Vaginal: normal rugae Cervix: stenotic os with clear mucoid discharge from os Adnexa: fullness bilaterally but no distinct mass noted.  No tenderness Uterus: anteverted, irregular enlargement and upper limits of normal size.  Sounds 8 cm Rectal: deferred  ULTRASOUND: Uterus: Length: 6 cm   Width:  4 cm   Height:  3 cm  Endo thickness:  16 mm   Left ovary:Normal WITH DOMINANT FOLLICLE Right ovary:Abnormal - Simple cyst 5cm x3.2cm x3.5cm Fibroids:no  CDS fluid:yes, small amt Comment: Anteverted uterus.  Cx contains hypoechoic area consistent with fluid.  Thickened endometrium difufusely.  Left adnexum contains a tubular cystic area which is coiled and could represent hydosalpinx. Urinary bladder unremarkable.  ENDOMETRIAL BIOPSY   INDICATION: prolonged menstrual bleeding UPT: Negative Indication, Risks and benefits have been discussed  PROCEDURE Cervix prepped with hibclens Tenaculum applied to anterior lip of the cervix: yes Cx dilated with os finder Uterus sounded at  8cm Endometrial biopsy easily performed with Pipelle Large amt tissue obtained Well tolerated  Specimen appropriately identified and sent to pathology  Assessment: Abnormal uterine bleeding Endometrial thickening, r/o neoplasia Right ovarian cyt Cystic left adnexal mass ? hydosalpinx  Plan: Endometrial biopsy Return to office based on results   Dierdre Forth, MD  06/29/2012 4:02 PM

## 2012-06-30 NOTE — Addendum Note (Signed)
Addended by: Lerry Liner D on: 06/30/2012 09:16 AM   Modules accepted: Orders

## 2012-07-04 ENCOUNTER — Other Ambulatory Visit: Payer: Self-pay

## 2012-07-04 DIAGNOSIS — N83209 Unspecified ovarian cyst, unspecified side: Secondary | ICD-10-CM

## 2012-07-16 ENCOUNTER — Other Ambulatory Visit: Payer: Self-pay

## 2012-10-13 ENCOUNTER — Emergency Department (HOSPITAL_COMMUNITY)
Admission: EM | Admit: 2012-10-13 | Discharge: 2012-10-13 | Disposition: A | Discharge status: Home / Self Care | Payer: 59 | Source: Home / Self Care

## 2012-10-13 ENCOUNTER — Encounter (HOSPITAL_COMMUNITY): Payer: Self-pay | Admitting: Emergency Medicine

## 2012-10-13 DIAGNOSIS — B373 Candidiasis of vulva and vagina: Secondary | ICD-10-CM

## 2012-10-13 DIAGNOSIS — N760 Acute vaginitis: Secondary | ICD-10-CM

## 2012-10-13 MED ORDER — FLUCONAZOLE 150 MG PO TABS: ORAL_TABLET | ORAL | Status: DC

## 2012-10-13 NOTE — ED Notes (Addendum)
Reports onset of yeast infection one month ago.  Patient has used 2 doses of monostat with relief.  Patient does not think it is gone away completely.  Patient reports diflucan usually works for her.  Denies pain , no pain in abdomen or back.  Reports slight vaginal discharge.  Main complaint is vaginal irritation

## 2012-10-13 NOTE — ED Provider Notes (Signed)
Medical screening examination/treatment/procedure(s) were performed by non-physician practitioner and as supervising physician I was immediately available for consultation/collaboration.  Laylamarie Meuser, M.D.  Japhet Morgenthaler C Brittain Smithey, MD 10/13/12 2204 

## 2012-10-13 NOTE — ED Provider Notes (Signed)
History     CSN: [id number]  Arrival date & time 10/13/12  1641   First MD Initiated Contact with Patient 10/13/12 1800      Chief Complaint  Patient presents with  . Vaginitis    (Consider location/radiation/quality/duration/timing/severity/associated sxs/prior treatment) HPI Comments:  Pleasant 47 year old female is complaining of vaginal discharge. She states that she notices the yeast infection as it is quite similar to the numerous 1 she has had in the past. She is complaining of a pale, yellow scant vaginal discharge producing vulvovaginal irritation and burning.  she denies pelvic pain or bleeding. The discharge began a little over a week ago. She has used a week with a Monistat and the discharge has improved but not quite abated. She is requesting Diflucan.   Past Medical History  Diagnosis Date  . 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 FRACTURE, HAND, LEFT 11/29/2007  . FRACTURE, PELVIS 11/29/2007  . HYPERLIPIDEMIA 01/06/2010  . SINUSITIS- ACUTE-NOS 10/12/2008    Past Surgical History  Procedure Laterality Date  . Left hand surgery      Dr. Eulah Pont  . Aspiration of left knee      Family History  Problem Relation Age of Onset  . Breast cancer Mother   . Hypertension Mother   . Alcohol abuse Father   . Hypertension Father     History  Substance Use Topics  . Smoking status: Never Smoker   . Smokeless tobacco: Not on file  . Alcohol Use: Yes    OB History   Grav Para Term Preterm Abortions TAB SAB Ect Mult Living   0               Review of Systems  Genitourinary: Positive for vaginal discharge and vaginal pain. Negative for dysuria, urgency, frequency, hematuria, flank pain, decreased urine volume, vaginal bleeding, enuresis, difficulty urinating, menstrual problem and pelvic pain.  All other systems reviewed and are negative.    Allergies  Eggs or egg-derived products; Glucosamine forte; and Shellfish allergy  Home Medications   Current Outpatient Rx   Name  Route  Sig  Dispense  Refill  . Biotin 10 MG CAPS   Oral   Take 1 capsule by mouth daily.         . clonazePAM (KLONOPIN) 0.5 MG tablet      1 tab by mouth in the am and 1-2 tabs in the pm as needed for stress and sleep   90 tablet   2   . diclofenac (VOLTAREN) 75 MG EC tablet   Oral   Take 1 tablet (75 mg total) by mouth 2 (two) times daily as needed.   60 tablet   5   . ferrous sulfate 325 (65 FE) MG tablet   Oral   Take 325 mg by mouth daily with breakfast.         . fish oil-omega-3 fatty acids 1000 MG capsule   Oral   Take 1 g by mouth daily.         . fluconazole (DIFLUCAN) 150 MG tablet      1 tab po x 1. May repeat in 72 hours if no improvement   2 tablet   0   . misoprostol (CYTOTEC) 200 MCG tablet      Pt to insert 1 tablet in vagina 12 hours prior to procedure;then insert 1 tablet in vagina 6 hours prior to procedure.   2 tablet   0   . Multiple Vitamin (MULTIVITAMIN) tablet  Oral   Take 1 tablet by mouth daily.           . vitamin E 400 UNIT capsule   Oral   Take 400 Units by mouth daily.           BP 130/79  Pulse 65  Temp(Src) 98.3 F (36.8 C) (Oral)  Resp 16  SpO2 100%  LMP 10/01/2012  Physical Exam  Nursing note and vitals reviewed. Constitutional: She is oriented to person, place, and time. She appears well-developed and well-nourished. No distress.  Eyes: EOM are normal.  Neck: Normal range of motion.  Cardiovascular: Normal rate.   Pulmonary/Chest: Effort normal. No respiratory distress.  Genitourinary:  It was agreed upon by the patient and the examiner that a pelvic exam would not be necessary at this particular time. The symptoms are quite similar to the one she said before she has no pain and she does have an appointment with her gynecologist .soon.  Musculoskeletal: She exhibits no edema.  Neurological: She is alert and oriented to person, place, and time. She exhibits normal muscle tone.  Skin: Skin is warm  and dry.  Psychiatric: She has a normal mood and affect.    ED Course  Procedures (including critical care time)  Labs Reviewed - No data to display No results found.   1. Vaginitis and vulvovaginitis   2. 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 It has improved with the topical Monistat but not abated. She was like to try Diflucan. Chart he has a follow up appointment with her gynecologist. Diflucan 150 mg one by mouth now may repeat in 2-3 days if needed.         Hayden Rasmussen, NP 10/13/12 1836

## 2012-10-25 ENCOUNTER — Other Ambulatory Visit: Payer: Self-pay

## 2012-10-25 DIAGNOSIS — F419 Anxiety disorder, unspecified: Secondary | ICD-10-CM

## 2012-10-25 MED ORDER — CLONAZEPAM 0.5 MG PO TABS: ORAL_TABLET | ORAL | Status: DC

## 2012-10-25 NOTE — Telephone Encounter (Signed)
Faxed hardcopy to [facility]

## 2012-10-25 NOTE — Telephone Encounter (Signed)
Done hardcopy to [facility]  

## 2012-11-30 ENCOUNTER — Other Ambulatory Visit: Payer: Self-pay | Admitting: Obstetrics and Gynecology

## 2012-11-30 DIAGNOSIS — Z1231 Encounter for screening mammogram for malignant neoplasm of breast: Secondary | ICD-10-CM

## 2012-12-21 ENCOUNTER — Ambulatory Visit
Admission: RE | Admit: 2012-12-21 | Discharge: 2012-12-21 | Disposition: A | Discharge status: Home / Self Care | Payer: 59 | Source: Ambulatory Visit | Attending: Obstetrics and Gynecology | Admitting: Obstetrics and Gynecology

## 2012-12-21 DIAGNOSIS — Z1231 Encounter for screening mammogram for malignant neoplasm of breast: Secondary | ICD-10-CM

## 2012-12-21 LAB — HM MAMMOGRAPHY

## 2013-01-02 ENCOUNTER — Ambulatory Visit (INDEPENDENT_AMBULATORY_CARE_PROVIDER_SITE_OTHER): Payer: 59 | Admitting: Internal Medicine

## 2013-01-02 ENCOUNTER — Encounter: Payer: Self-pay | Admitting: Internal Medicine

## 2013-01-02 VITALS — BP 130/82 | HR 62 | Temp 98.3°F | Wt 184.8 lb

## 2013-01-02 DIAGNOSIS — M7021 Olecranon bursitis, right elbow: Secondary | ICD-10-CM

## 2013-01-02 DIAGNOSIS — M7020 Olecranon bursitis, unspecified elbow: Secondary | ICD-10-CM

## 2013-01-02 MED ORDER — PREDNISONE 10 MG PO TABS: ORAL_TABLET | ORAL | Status: DC

## 2013-01-02 NOTE — Patient Instructions (Signed)
Olecranon Bursitis  °with Rehab °A bursa is a fluid filled sac that is located between soft tissues (ligaments, tendons, skin) and bones. The purpose of a bursa is to allow the soft tissue to function smoothly, without friction. The olecrenon bursa is located between the back of the elbow (olecrenon) and the skin. Olecrenon bursitis involves inflammation of this bursa, resulting in pain. °SYMPTOMS  °· Pain, tenderness, swelling, warmth, or redness over the back of the elbow. °· Reduced range of motion of the affected elbow. °· Sometimes, severe pain with movement of the affected elbow. °· Crackling sound (crepitation) when the bursa is moved or touched. °· Often, painless swelling of the bursa. °· Fever (when infected). °CAUSES  °Olecranon bursitis is often caused by direct hit (trauma) to the elbow. Less commonly, it is due to overuse and/or strenuous exercise that the elbow is not used to. °RISK INCREASES WITH: °· Sports that require bending or landing on the elbow (football, volleyball). °· Vigorous or repetitive athletic training, or sudden increase or change in activity level (weekend warriors). °· Failure to warm up properly before activity. °· Poor exercise technique. °· Playing on artificial turf. °PREVENTION °· Avoid injuries and the overuse of muscles whenever possible. °· Warm up and stretch properly before activity. °· Allow for adequate recovery between workouts. °· Maintain physical fitness: °· Strength, flexibility, and endurance. °· Cardiovascular fitness. °· Learn and use proper technique. °· Wear properly fitted and padded protective equipment. °PROGNOSIS  °If treated properly, olecranon bursitis is usually curable within 2 weeks.  °RELATED COMPLICATIONS  °· Longer healing time, if not properly treated or if not given enough time to heal. °· Recurring symptoms that result in a chronic problem. °· Joint stiffness with permanent limitation of the affected joint's movement. °· Infection of the  bursa. °· Chronic inflammation or scarring of the bursa. °TREATMENT °Treatment first involves the use of ice and medicine, to reduce pain and inflammation. The use of strengthening and stretching exercises may help reduce pain with activity. These exercises may be performed at home or with a therapist. Elbow pads may be advised, to protect the bursa. If symptoms persist, despite non-surgical treatment, a procedure to withdraw fluid from the bursa may be advised. This procedure may be accompanied with an injection of corticosteroids, to reduce inflammation. Sometimes, surgery is needed to remove the bursa. °MEDICATION °· If pain medicine is needed, nonsteroidal anti-inflammatory medicines (aspirin and ibuprofen), or other minor pain relievers (acetaminophen), are often advised. °· Do not take pain medicine for 7 days before surgery. °· Prescription pain relievers may be given, if your caregiver thinks they are needed. Use only as directed and only as much as you need. °· Corticosteroid injections may be given by your caregiver. These injections should be reserved for the most serious cases, because they may only be given a certain number of times. °HEAT AND COLD °· Cold treatment (icing) should be applied for 10 to 15 minutes every 2 to 3 hours for inflammation and pain, and immediately after activity that aggravates your symptoms. Use ice packs or an ice massage. °· Heat treatment may be used before performing stretching and strengthening activities prescribed by your caregiver, physical therapist, or athletic trainer. Use a heat pack or a warm water soak. °SEEK IMMEDIATE MEDICAL CARE IF:  °· Symptoms get worse or do not improve in 2 weeks, despite treatment. °· Signs of infection develop, including fever of 102° F (38.9° C), increased pain, redness, warmth, or pus draining from   the bursa. °· New, unexplained symptoms develop. (Drugs used in treatment may produce side effects.) °EXERCISES  °RANGE OF MOTION (ROM) AND  STRETCHING EXERCISES - Olecranon Bursitis °These exercises may help you when beginning to rehabilitate your injury. Your symptoms may resolve with or without further involvement from your physician, physical therapist or athletic trainer. While completing these exercises, remember:  °· Restoring tissue flexibility helps normal motion to return to the joints. This allows healthier, less painful movement and activity. °· An effective stretch should be held for at least 30 seconds. °· A stretch should never be painful. You should only feel a gentle lengthening or release in the stretched tissue. °RANGE OF MOTION  Elbow Flexion, Supine °· Lie on your back. Extend your right / left arm into the air, bracing it with your opposite hand. Allow your right / left arm to relax. °· Let your elbow bend, allowing your hand to fall slowly toward your chest. °· You should feel a gentle stretch along the back of your upper arm and elbow. Your physician, physical therapist or athletic trainer may ask you to hold a __________ hand weight to increase the intensity of this stretch. °· Hold for __________ seconds. Slowly return your right / left arm to the upright position. °Repeat __________ times. Complete this exercise __________ times per day. °STRETCH  Elbow Flexors °· Lie on a firm bed or countertop on your back. Be sure that you are in a comfortable position which will allow you to relax your arm muscles. °· Place a folded towel under your right / left upper arm, so that your elbow and shoulder are at the same height. Extend your arm; your elbow should not rest on the bed or towel °· Allow the weight of your hand to straighten your elbow. Keep your arm and chest muscles relaxed. Your caregiver may ask you to increase the intensity of your stretch by adding a small wrist or hand weight. °· Hold for __________ seconds. You should feel a stretch on the inside of your elbow. Slowly return to the starting position. °Repeat __________  times. Complete this exercise __________ times per day. °STRENGTHENING EXERCISES - Olecranon Bursitis °These exercises will help you regain your strength. They may resolve your symptoms with or without further involvement from your physician, physical therapist or athletic trainer. While completing these exercises, remember:  °· Muscles can gain both the endurance and the strength needed for everyday activities through controlled exercises. °· Complete these exercises as instructed by your physician, physical therapist or athletic trainer. Increase the resistance and repetitions only as guided by your caregiver. °· You may experience muscle soreness or fatigue, but the pain or discomfort you are trying to eliminate should never worsen during these exercises. If this pain does worsen, stop and make certain you are following the directions exactly. If the pain is still present after adjustments, discontinue the exercise until you can discuss the trouble with your caregiver. °STRENGTH - Elbow Extensors, Isometric °· Stand or sit upright on a firm surface. Place your right / left arm so that your palm faces your stomach, and it is at the height of your waist. °· Place your opposite hand on the underside of your forearm. Gently push up as your right / left arm resists. Push as hard as you can with both arms without causing any pain or movement at your right / left elbow. Hold this stationary position for __________ seconds. °· Gradually release the tension in both arms. Allow   your muscles to relax completely before repeating. °Repeat __________ times. Complete this exercise __________ times per day. °STRENGTH - Elbow Flexors, Isometric °· Stand or sit upright on a firm surface. Place your right / left arm so that your hand is palm-up and at the height of your waist. °· Place your opposite hand on top of your forearm. Gently push down as your right / left arm resists. Push as hard as you can with both arms without causing  any pain or movement at your right / left elbow. Hold this stationary position for __________ seconds. °· Gradually release the tension in both arms. Allow your muscles to relax completely before repeating. °Repeat __________ times. Complete this exercise __________ times per day. °STRENGTH  Elbow Flexors, Supinated °· With good posture, stand or sit on a firm chair without armrests. Allow your right / left arm to rest at your side with your palm facing forward. °· Holding a __________ weight, or gripping a rubber exercise band or tubing, °· bring your hand toward your shoulder. °· Allow your muscles to control the resistance as your hand returns to your side. °Repeat __________ times. Complete this exercise __________ times per day.  °STRENGTH  Elbow Flexors, Neutral °· With good posture, stand or sit on a firm chair without armrests. Allow your right / left arm to rest at your side with your thumb facing forward. °· Holding a __________weight, or gripping a rubber exercise band or tubing, °· bring your hand toward your shoulder. °· Allow your muscles to control the resistance as your hand returns to your side. °Repeat __________ times. Complete this exercise __________ times per day.  °STRENGTH  Elbow Extensors °· Lie on your back. Extend your right / left elbow into the air, pointing it toward the ceiling. Brace your arm with your opposite hand.* °· Holding a __________ weight in your hand, slowly straighten your right / left elbow. °· Allow your muscles to control the weight as your hand returns to its starting position. °Repeat __________ times. Complete this exercise __________ times per day. °*You may also stand with your elbow overhead and pointed toward the ceiling, supported by your opposite hand. °STRENGTH - Elbow Extensors, Dynamic °· With good posture, stand, or sit on a firm chair without armrests. Keeping your upper arms at your side, bring both hands up to your right / left shoulder while gripping a  rubber exercise band or tubing. Your right / left hand should be just below the other hand. °· Straighten your right / left elbow. Hold for __________ seconds. °· Allow your muscles to control the rubber exercise band, as your hand returns to your shoulder. °Repeat __________ times. Complete this exercise __________ times per day. °Document Released: 05/18/2005 Document Revised: 08/10/2011 Document Reviewed: 08/30/2008 °[employer]® Patient Information ©2014 [employer]. ° °

## 2013-01-02 NOTE — Progress Notes (Signed)
Subjective:    Patient ID: Ruth Medina, female    DOB: 11/09/1965, 47 y.o.   MRN: [id number]  HPI  Pt presents to the clinic today with c/o right elbow pain. This started about 1 month ago. The pain is on the ball of the elbow. It does shoot down the forearm. The pain is constant. She has taken ibuprofen every hours also using a compression band which has not helped. She does think she may have strained her elbow while lifting weights. She denies any specific injury to the area.  Review of Systems      Past Medical History  Diagnosis Date  . 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 FRACTURE, HAND, LEFT 11/29/2007  . FRACTURE, PELVIS 11/29/2007  . HYPERLIPIDEMIA 01/06/2010  . SINUSITIS- ACUTE-NOS 10/12/2008    Current Outpatient Prescriptions  Medication Sig Dispense Refill  . Biotin 10 MG CAPS Take 1 capsule by mouth daily.      . clonazePAM (KLONOPIN) 0.5 MG tablet 1 tab by mouth in the am and 1-2 tabs in the pm as needed for stress and sleep  90 tablet  2  . diclofenac (VOLTAREN) 75 MG EC tablet Take 1 tablet (75 mg total) by mouth 2 (two) times daily as needed.  60 tablet  5  . ferrous sulfate 325 (65 FE) MG tablet Take 325 mg by mouth daily with breakfast.      . fish oil-omega-3 fatty acids 1000 MG capsule Take 1 g by mouth daily.      . fluconazole (DIFLUCAN) 150 MG tablet 1 tab po x 1. May repeat in 72 hours if no improvement  2 tablet  0  . misoprostol (CYTOTEC) 200 MCG tablet Pt to insert 1 tablet in vagina 12 hours prior to procedure;then insert 1 tablet in vagina 6 hours prior to procedure.  2 tablet  0  . Multiple Vitamin (MULTIVITAMIN) tablet Take 1 tablet by mouth daily.        . vitamin E 400 UNIT capsule Take 400 Units by mouth daily.       No current facility-administered medications for this visit.    Allergies  Allergen Reactions  . Eggs Or Egg-Derived Products Nausea And Vomiting  . Glucosamine Forte (Nutritional Supplements) Rash  . Shellfish Allergy Hives and  Rash    Family History  Problem Relation Age of Onset  . Breast cancer Mother   . Hypertension Mother   . Alcohol abuse Father   . Hypertension Father     History   Social History  . Marital Status: Single    Spouse Name: N/A    Number of Children: N/A  . Years of Education: N/A   Occupational History  . Not on file.   Social History Main Topics  . Smoking status: Never Smoker   . Smokeless tobacco: Not on file  . Alcohol Use: Yes  . Drug Use: No  . Sexually Active: Yes    Birth Control/ Protection: Diaphragm   Other Topics Concern  . Not on file   Social History Narrative  . No narrative on file     Constitutional: Denies fever, malaise, fatigue, headache or abrupt weight changes.  Musculoskeletal: Pt reports elbow pain. Denies decrease in range of motion, difficulty with gait, muscle pain or joint swelling.  Skin: Denies redness, rashes, lesions or ulcercations.   No other specific complaints in a complete review of systems (except as listed in HPI above).   Objective:   Physical Exam  BP 130/82  Pulse 62  Temp(Src) 98.3 F (36.8 C) (Oral)  Wt 184 lb 12.8 oz (83.825 kg)  BMI 25.79 kg/m2  SpO2 99% Wt Readings from Last 3 Encounters:  01/02/13 184 lb 12.8 oz (83.825 kg)  06/30/12 186 lb (84.369 kg)  06/05/12 185 lb 3.2 oz (84.006 kg)    General: Appears her stated age, well developed, well nourished in NAD. Skin: Warm, dry and intact. No rashes, lesions or ulcerations noted. Cardiovascular: Normal rate and rhythm. S1,S2 noted.  No murmur, rubs or gallops noted. No JVD or BLE edema. No carotid bruits noted. Pulmonary/Chest: Normal effort and positive vesicular breath sounds. No respiratory distress. No wheezes, rales or ronchi noted.  Musculoskeletal: Normal range of motion. No signs of joint swelling. No difficulty with gait. Pinpoint tenderness in the right olecranon bursa   BMET    Component Value Date/Time   NA 139 04/10/2011 1626   K 3.8  04/10/2011 1626   CL 103 04/10/2011 1626   CO2 28 04/10/2011 1626   GLUCOSE 81 04/10/2011 1626   BUN 15 04/10/2011 1626   CREATININE 1.0 04/10/2011 1626   CALCIUM 9.0 04/10/2011 1626   GFRNONAA 82.49 01/06/2010 0903   GFRAA 78 11/29/2007 1502    Lipid Panel     Component Value Date/Time   CHOL 186 04/10/2011 1626   TRIG 74.0 04/10/2011 1626   HDL 71.20 04/10/2011 1626   CHOLHDL 3 04/10/2011 1626   VLDL 14.8 04/10/2011 1626   LDLCALC 100* 04/10/2011 1626    CBC    Component Value Date/Time   WBC 6.5 06/05/2012 1545   RBC 5.32* 06/05/2012 1545   HGB 11.8* 06/05/2012 1545   HCT 37.5 06/05/2012 1545   PLT 359 06/05/2012 1545   MCV 70.5* 06/05/2012 1545   MCH 22.2* 06/05/2012 1545   MCHC 31.5 06/05/2012 1545   RDW 16.0* 06/05/2012 1545   LYMPHSABS 1.7 04/10/2011 1626   MONOABS 0.5 04/10/2011 1626   EOSABS 0.6 04/10/2011 1626   BASOSABS 0.1 04/10/2011 1626    Hgb A1C No results found for this basename: HGBA1C        Assessment & Plan:   Olecranon bursitis, right elbow, new onset:  eRx for pred taper Continue advil as needed  RTC as needed or if symptoms persist or worsen

## 2013-01-03 ENCOUNTER — Telehealth: Payer: Self-pay | Admitting: *Deleted

## 2013-01-03 NOTE — Telephone Encounter (Signed)
Pt called requesting clarification on Rx.  Read Rx instructions as written on med list.

## 2013-01-03 NOTE — Telephone Encounter (Signed)
It looks clear to me, what is she not understanding or what specific question does she have about it.

## 2013-03-14 ENCOUNTER — Encounter: Payer: Self-pay | Admitting: Internal Medicine

## 2013-03-14 ENCOUNTER — Ambulatory Visit (INDEPENDENT_AMBULATORY_CARE_PROVIDER_SITE_OTHER): Payer: 59 | Admitting: Internal Medicine

## 2013-03-14 DIAGNOSIS — B379 Candidiasis, unspecified: Secondary | ICD-10-CM

## 2013-03-14 DIAGNOSIS — L239 Allergic contact dermatitis, unspecified cause: Secondary | ICD-10-CM

## 2013-03-14 DIAGNOSIS — L259 Unspecified contact dermatitis, unspecified cause: Secondary | ICD-10-CM

## 2013-03-14 MED ORDER — METHYLPREDNISOLONE ACETATE 80 MG/ML IJ SUSP
80.0000 mg | Freq: Once | INTRAMUSCULAR | Status: AC
  Administered 2013-03-14: 80 mg via INTRAMUSCULAR

## 2013-03-14 MED ORDER — FLUCONAZOLE 150 MG PO TABS: 150.0000 mg | ORAL_TABLET | Freq: Once | ORAL | Status: DC

## 2013-03-14 NOTE — Progress Notes (Signed)
Subjective:    Patient ID: Ruth Medina, female    DOB: [birth year], 47 y.o.   MRN: [id number]  HPI  Pt presents to the clinic today with c/o getting a egg-free flu vaccine 5 days ago at [facility].On Friday she woke up with rash, and itching all over her body. She has taken Benadryl at night with only mild relief. Additionally she c/o a yeast infection. This started 2 days ago. She has tried OTC Monistat without relief. She has not been able to get in with her gyn. Review of Systems      Past Medical History  Diagnosis Date  . 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 FRACTURE, HAND, LEFT 11/29/2007  . FRACTURE, PELVIS 11/29/2007  . HYPERLIPIDEMIA 01/06/2010  . SINUSITIS- ACUTE-NOS 10/12/2008    Current Outpatient Prescriptions  Medication Sig Dispense Refill  . Biotin 10 MG CAPS Take 1 capsule by mouth daily.      . clonazePAM (KLONOPIN) 0.5 MG tablet 1 tab by mouth in the am and 1-2 tabs in the pm as needed for stress and sleep  90 tablet  2  . diclofenac (VOLTAREN) 75 MG EC tablet Take 1 tablet (75 mg total) by mouth 2 (two) times daily as needed.  60 tablet  5  . ferrous sulfate 325 (65 FE) MG tablet Take 325 mg by mouth daily with breakfast.      . fish oil-omega-3 fatty acids 1000 MG capsule Take 1 g by mouth daily.      . Multiple Vitamin (MULTIVITAMIN) tablet Take 1 tablet by mouth daily.        . predniSONE (DELTASONE) 10 MG tablet Take 3 tablets on days 1-3, take 2 tablets on days 4-6, take 1 tablet on days 7-9  18 tablet  0  . vitamin E 400 UNIT capsule Take 400 Units by mouth daily.       No current facility-administered medications for this visit.    Allergies  Allergen Reactions  . Eggs Or Egg-Derived Products Nausea And Vomiting  . Glucosamine Forte [Nutritional Supplements] Rash  . Shellfish Allergy Hives and Rash    Family History  Problem Relation Age of Onset  . Breast cancer Mother   . Hypertension Mother   . Alcohol abuse Father   . Hypertension  Father     History   Social History  . Marital Status: Single    Spouse Name: N/A    Number of Children: N/A  . Years of Education: N/A   Occupational History  . Not on file.   Social History Main Topics  . Smoking status: Never Smoker   . Smokeless tobacco: Not on file  . Alcohol Use: Yes  . Drug Use: Yes  . Sexual Activity: Yes    Birth Control/ Protection: Diaphragm   Other Topics Concern  . Not on file   Social History Narrative  . No narrative on file     Constitutional: Denies fever, malaise, fatigue, headache or abrupt weight changes.  GU: Pt reports yeast infection. Denies urgency, frequency, pain with urination, burning sensation, blood in urine, odor  Skin: Pt reports generalized itchy rash. Denies redness,  lesions or ulcercations.    No other specific complaints in a complete review of systems (except as listed in HPI above).  Objective:   Physical Exam  There were no vitals taken for this visit. Wt Readings from Last 3 Encounters:  01/02/13 184 lb 12.8 oz (83.825 kg)  06/30/12 186 lb (84.369 kg)  06/05/12 185 lb 3.2 oz (84.006 kg)    General: Appears her stated age, well developed, well nourished in NAD. Skin: Warm, dry and intact. Generalized papular rash on back, buttock and bilateral upper extremeties. Cardiovascular: Normal rate and rhythm. S1,S2 noted.  No murmur, rubs or gallops noted. No JVD or BLE edema. No carotid bruits noted. Pulmonary/Chest: Normal effort and positive vesicular breath sounds. No respiratory distress. No wheezes, rales or ronchi noted.  Abdomen: Soft and nontender. Normal bowel sounds, no bruits noted. No distention or masses noted. Liver, spleen and kidneys non palpable. GYN exam deferred.   BMET    Component Value Date/Time   NA 139 04/10/2011 1626   K 3.8 04/10/2011 1626   CL 103 04/10/2011 1626   CO2 28 04/10/2011 1626   GLUCOSE 81 04/10/2011 1626   BUN 15 04/10/2011 1626   CREATININE 1.0 04/10/2011 1626   CALCIUM  9.0 04/10/2011 1626   GFRNONAA 82.49 01/06/2010 0903   GFRAA 78 11/29/2007 1502    Lipid Panel     Component Value Date/Time   CHOL 186 04/10/2011 1626   TRIG 74.0 04/10/2011 1626   HDL 71.20 04/10/2011 1626   CHOLHDL 3 04/10/2011 1626   VLDL 14.8 04/10/2011 1626   LDLCALC 100* 04/10/2011 1626    CBC    Component Value Date/Time   WBC 6.5 06/05/2012 1545   RBC 5.32* 06/05/2012 1545   HGB 11.8* 06/05/2012 1545   HCT 37.5 06/05/2012 1545   PLT 359 06/05/2012 1545   MCV 70.5* 06/05/2012 1545   MCH 22.2* 06/05/2012 1545   MCHC 31.5 06/05/2012 1545   RDW 16.0* 06/05/2012 1545   LYMPHSABS 1.7 04/10/2011 1626   MONOABS 0.5 04/10/2011 1626   EOSABS 0.6 04/10/2011 1626   BASOSABS 0.1 04/10/2011 1626    Hgb A1C No results found for this basename: HGBA1C         Assessment & Plan:   Allergic Dermatitis secondary to flu vaccine:  80 mg Depo IM Benadryl at night If no better in 24 hours, call back and I will call you in a pred taper  Yeast infection:  eRx for Diflucan 150 mg PO x 1  RTC as needed or if symptoms persist or worsen

## 2013-03-14 NOTE — Patient Instructions (Signed)

## 2013-03-14 NOTE — Addendum Note (Signed)
Addended by: Bethann Punches E on: 03/14/2013 04:56 PM   Modules accepted: Orders

## 2013-04-06 ENCOUNTER — Other Ambulatory Visit: Payer: Self-pay

## 2013-05-12 ENCOUNTER — Encounter (HOSPITAL_COMMUNITY): Payer: Self-pay | Admitting: Emergency Medicine

## 2013-05-12 ENCOUNTER — Other Ambulatory Visit (HOSPITAL_COMMUNITY)
Admission: RE | Admit: 2013-05-12 | Discharge: 2013-05-12 | Disposition: A | Discharge status: Home / Self Care | Payer: 59 | Source: Ambulatory Visit | Attending: Emergency Medicine | Admitting: Emergency Medicine

## 2013-05-12 ENCOUNTER — Emergency Department (HOSPITAL_COMMUNITY)
Admission: EM | Admit: 2013-05-12 | Discharge: 2013-05-12 | Disposition: A | Discharge status: Home / Self Care | Payer: 59 | Source: Home / Self Care | Attending: Emergency Medicine | Admitting: Emergency Medicine

## 2013-05-12 DIAGNOSIS — N760 Acute vaginitis: Secondary | ICD-10-CM | POA: Insufficient documentation

## 2013-05-12 DIAGNOSIS — Z113 Encounter for screening for infections with a predominantly sexual mode of transmission: Secondary | ICD-10-CM | POA: Insufficient documentation

## 2013-05-12 LAB — POCT URINALYSIS DIP (DEVICE)
Hgb urine dipstick: NEGATIVE
Ketones, ur: NEGATIVE mg/dL
Protein, ur: NEGATIVE mg/dL
Specific Gravity, Urine: 1.01 (ref 1.005–1.030)
Urobilinogen, UA: 0.2 mg/dL (ref 0.0–1.0)
pH: 5 (ref 5.0–8.0)

## 2013-05-12 MED ORDER — FLUCONAZOLE 150 MG PO TABS: 150.0000 mg | ORAL_TABLET | Freq: Once | ORAL | Status: DC

## 2013-05-12 MED ORDER — TERCONAZOLE 0.4 % VA CREA: 1.0000 | TOPICAL_CREAM | Freq: Every day | VAGINAL | Status: DC

## 2013-05-12 NOTE — ED Notes (Signed)
Pt   Reports  symptoms  Of vaginal  Discharge         /  Irritation       X  sev  Weeks            Symptoms  Not  releived  By       [facility]

## 2013-05-12 NOTE — ED Provider Notes (Signed)
Chief Complaint:   Chief Complaint  Patient presents with  . 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 She's had slight dysuria. She denies any odor, pelvic pain, lower back pain, fever, chills, nausea, or vomiting. She has had a history of yeast infections in the past and has gotten good results with Diflucan. She tried some over-the-counter Monistat 7 without relief.  Review of Systems:  Other than noted above, the patient denies any of the following symptoms: Systemic:  No fever, chills, sweats, or weight loss. GI:  No abdominal pain, nausea, anorexia, vomiting, diarrhea, constipation, melena or hematochezia. GU:  No dysuria, frequency, urgency, hematuria, vaginal discharge, itching, or abnormal vaginal bleeding. Skin:  No rash or itching.  PMFSH:  Past medical history, family history, social history, meds, and allergies were reviewed.    Physical Exam:   Vital signs:  BP 131/86  Pulse 78  Temp(Src) 99 F (37.2 C) (Oral)  Resp 18  SpO2 100%  LMP 04/20/2013 General:  Alert, oriented and in no distress. Lungs:  Breath sounds clear and equal bilaterally.  No wheezes, rales or rhonchi. Heart:  Regular rhythm.  No gallops or murmers. Abdomen:  Soft, flat and non-distended.  No organomegaly or mass.  No tenderness, guarding or rebound.  Bowel sounds normally active. Pelvic exam:  Normal external genitalia. Vaginal and cervical mucosa were normal. There was a scant amount of white discharge without any odor. There was no pain on cervical motion. Uterus was normal in size and shape and nontender. No adnexal tenderness or mass. DNA probe for gonorrhea, Chlamydia, Trichomonas, Gardnerella were obtained. Skin:  Clear, warm and dry.  Labs:   Results for orders placed during the  hospital encounter of 05/12/13  POCT URINALYSIS DIP (DEVICE)      Result Value Range   Glucose, UA NEGATIVE  NEGATIVE mg/dL   Bilirubin Urine NEGATIVE  NEGATIVE   Ketones, ur NEGATIVE  NEGATIVE mg/dL   Specific Gravity, Urine 1.010  1.005 - 1.030   Hgb urine dipstick NEGATIVE  NEGATIVE   pH 5.0  5.0 - 8.0   Protein, ur NEGATIVE  NEGATIVE mg/dL   Urobilinogen, UA 0.2  0.0 - 1.0 mg/dL   Nitrite NEGATIVE  NEGATIVE   Leukocytes, UA TRACE (*) NEGATIVE  POCT PREGNANCY, URINE      Result Value Range   Preg Test, Ur NEGATIVE  NEGATIVE    Assessment:  The encounter diagnosis was Vaginitis.  This is probably yeast. DNA probes are pending. Will treat with Diflucan and terconazole.  Plan:   1.  Meds:  The following meds were prescribed:   Discharge Medication List as of 05/12/2013  6:32 PM    START taking these medications   Details  !! fluconazole (DIFLUCAN) 150 MG tablet Take 1 tablet (150 mg total) by mouth once., Starting 05/12/2013, Normal    terconazole (TERAZOL 7) 0.4 % vaginal cream Place 1 applicator vaginally at bedtime., Starting 05/12/2013, Until Discontinued, Normal     !! - Potential duplicate medications found. Please discuss with provider.      2.  Patient Education/Counseling:  The patient was given appropriate handouts, self care instructions, and instructed in symptomatic relief. Suggested probiotics for prevention.  3.  Follow up:  The patient was told to follow  up if no better in 3 to 4 days, if becoming worse in any way, and given some red flag symptoms such as pelvic pain or fever which would prompt immediate return.  Follow up here as needed or with her gynecologist.     Reuben Likes, MD 05/12/13 2130

## 2013-05-30 ENCOUNTER — Telehealth (HOSPITAL_COMMUNITY): Payer: Self-pay | Admitting: *Deleted

## 2013-05-30 NOTE — ED Notes (Signed)
Pt. called on VM and said [facility] had sent [place] a refill request for her medication. She was seen 2 weeks ago.  I called her back and told her we don't do refills because we are not a primary care facility. She would need to come back here to be seen again. Pt. voiced understanding. Vassie Moselle 05/30/2013

## 2013-07-10 ENCOUNTER — Other Ambulatory Visit: Payer: Self-pay

## 2013-07-10 DIAGNOSIS — F419 Anxiety disorder, unspecified: Secondary | ICD-10-CM

## 2013-07-10 NOTE — Telephone Encounter (Signed)
Please advise refill? Last RX was done on 10-25-12 quantity 90 with 2 refills

## 2013-07-11 MED ORDER — CLONAZEPAM 0.5 MG PO TABS: ORAL_TABLET | ORAL | Status: DC

## 2013-07-11 NOTE — Telephone Encounter (Signed)
Faxed hardcopy to [facility]  

## 2013-07-11 NOTE — Telephone Encounter (Signed)
Done hardcopy to [facility]  

## 2013-09-15 ENCOUNTER — Telehealth: Payer: Self-pay

## 2013-09-15 DIAGNOSIS — Z0000 Encounter for general adult medical examination without abnormal findings: Secondary | ICD-10-CM

## 2013-09-15 NOTE — Telephone Encounter (Signed)
Orders done

## 2013-09-15 NOTE — Telephone Encounter (Signed)
Patient notified and appt scheduled.

## 2013-09-15 NOTE — Telephone Encounter (Signed)
Patient called [facility] stating that she has appointment 09/27/13 and would like to request orders to have labs drawn prior to appointment. Thanks

## 2013-09-19 ENCOUNTER — Other Ambulatory Visit (INDEPENDENT_AMBULATORY_CARE_PROVIDER_SITE_OTHER): Payer: 59

## 2013-09-19 DIAGNOSIS — Z0000 Encounter for general adult medical examination without abnormal findings: Secondary | ICD-10-CM

## 2013-09-19 LAB — LIPID PANEL
CHOL/HDL RATIO: 3
CHOLESTEROL: 224 mg/dL — AB (ref 0–200)
HDL: 72 mg/dL (ref >39.00)
LDL Cholesterol: 136 mg/dL — ABNORMAL HIGH (ref 0–99)
TRIGLYCERIDES: 81 mg/dL (ref 0.0–149.0)
VLDL: 16.2 mg/dL (ref 0.0–40.0)

## 2013-09-19 LAB — CBC WITH DIFFERENTIAL/PLATELET
Basophils Absolute: 0.1 10*3/uL (ref 0.0–0.1)
Basophils Relative: 1.2 % (ref 0.0–3.0)
EOS PCT: 10.6 % — AB (ref 0.0–5.0)
Eosinophils Absolute: 0.6 10*3/uL (ref 0.0–0.7)
HCT: 39.5 % (ref 36.0–46.0)
Hemoglobin: 12.5 g/dL (ref 12.0–15.0)
LYMPHS ABS: 1.3 10*3/uL (ref 0.7–4.0)
Lymphocytes Relative: 24.8 % (ref 12.0–46.0)
MCHC: 31.5 g/dL (ref 30.0–36.0)
MCV: 72.1 fl — ABNORMAL LOW (ref 78.0–100.0)
MONO ABS: 0.4 10*3/uL (ref 0.1–1.0)
MONOS PCT: 7 % (ref 3.0–12.0)
NEUTROS ABS: 3.1 10*3/uL (ref 1.4–7.7)
Neutrophils Relative %: 56.4 % (ref 43.0–77.0)
Platelets: 373 10*3/uL (ref 150.0–400.0)
RBC: 5.48 Mil/uL — AB (ref 3.87–5.11)
RDW: 15 % — ABNORMAL HIGH (ref 11.5–14.6)
WBC: 5.4 10*3/uL (ref 4.5–10.5)

## 2013-09-19 LAB — HEPATIC FUNCTION PANEL
ALT: 18 U/L (ref 0–35)
AST: 23 U/L (ref 0–37)
Albumin: 4 g/dL (ref 3.5–5.2)
Alkaline Phosphatase: 39 U/L (ref 39–117)
BILIRUBIN DIRECT: 0 mg/dL (ref 0.0–0.3)
TOTAL PROTEIN: 7.8 g/dL (ref 6.0–8.3)
Total Bilirubin: 0.5 mg/dL (ref 0.3–1.2)

## 2013-09-19 LAB — URINALYSIS, ROUTINE W REFLEX MICROSCOPIC
BILIRUBIN URINE: NEGATIVE
Hgb urine dipstick: NEGATIVE
KETONES UR: NEGATIVE
Leukocytes, UA: NEGATIVE
Nitrite: NEGATIVE
PH: 5.5 (ref 5.0–8.0)
SPECIFIC GRAVITY, URINE: 1.015 (ref 1.000–1.030)
TOTAL PROTEIN, URINE-UPE24: NEGATIVE
URINE GLUCOSE: NEGATIVE
Urobilinogen, UA: 0.2 (ref 0.0–1.0)

## 2013-09-19 LAB — BASIC METABOLIC PANEL
BUN: 14 mg/dL (ref 6–23)
CHLORIDE: 104 meq/L (ref 96–112)
CO2: 27 meq/L (ref 19–32)
CREATININE: 0.8 mg/dL (ref 0.4–1.2)
Calcium: 9.7 mg/dL (ref 8.4–10.5)
GFR: 93.74 mL/min (ref >60.00)
GLUCOSE: 82 mg/dL (ref 70–99)
Potassium: 4.4 mEq/L (ref 3.5–5.1)
Sodium: 139 mEq/L (ref 135–145)

## 2013-09-19 LAB — TSH: TSH: 0.69 u[IU]/mL (ref 0.35–5.50)

## 2013-09-27 ENCOUNTER — Ambulatory Visit (INDEPENDENT_AMBULATORY_CARE_PROVIDER_SITE_OTHER): Payer: 59 | Admitting: Family Medicine

## 2013-09-27 ENCOUNTER — Ambulatory Visit (INDEPENDENT_AMBULATORY_CARE_PROVIDER_SITE_OTHER): Payer: 59

## 2013-09-27 ENCOUNTER — Ambulatory Visit (INDEPENDENT_AMBULATORY_CARE_PROVIDER_SITE_OTHER): Payer: 59 | Admitting: Internal Medicine

## 2013-09-27 ENCOUNTER — Encounter: Payer: Self-pay | Admitting: Internal Medicine

## 2013-09-27 ENCOUNTER — Encounter: Payer: Self-pay | Admitting: Family Medicine

## 2013-09-27 VITALS — BP 108/70 | HR 77 | Ht 71.0 in | Wt 187.0 lb

## 2013-09-27 VITALS — BP 108/70 | HR 77 | Temp 98.9°F | Ht 71.0 in | Wt 187.4 lb

## 2013-09-27 DIAGNOSIS — M25562 Pain in left knee: Secondary | ICD-10-CM

## 2013-09-27 DIAGNOSIS — F411 Generalized anxiety disorder: Secondary | ICD-10-CM

## 2013-09-27 DIAGNOSIS — M25569 Pain in unspecified knee: Secondary | ICD-10-CM

## 2013-09-27 DIAGNOSIS — M6740 Ganglion, unspecified site: Secondary | ICD-10-CM | POA: Insufficient documentation

## 2013-09-27 DIAGNOSIS — Z0001 Encounter for general adult medical examination with abnormal findings: Secondary | ICD-10-CM | POA: Insufficient documentation

## 2013-09-27 DIAGNOSIS — E041 Nontoxic single thyroid nodule: Secondary | ICD-10-CM | POA: Insufficient documentation

## 2013-09-27 DIAGNOSIS — F419 Anxiety disorder, unspecified: Secondary | ICD-10-CM

## 2013-09-27 DIAGNOSIS — Z0000 Encounter for general adult medical examination without abnormal findings: Secondary | ICD-10-CM

## 2013-09-27 MED ORDER — CLONAZEPAM 0.5 MG PO TABS: ORAL_TABLET | ORAL | Status: DC

## 2013-09-27 NOTE — Patient Instructions (Signed)
Good to meet you Ice 20 minutes after activity Wear compression sleeve with activity and the 30 minutes afterward We may have to drain it again.  If you want to discuss diet come back again.  45 minutes then switch to gatorade.

## 2013-09-27 NOTE — Assessment & Plan Note (Signed)
After verbal consent patient was prepped with alcohol swabs and under ultrasound guidance patient was injected with 3 cc of 0.5% Marcaine and then with an 18-gauge needle did have some mild aspiration the patient's ganglion cyst. This appears to be fairly firm and not a significant amount was able to be aspirated. 1 cc of Kenalog 40 mg/dL then was injected. Patient tolerated the procedure well. Posting section instructions given.  Patient was given icing protocol and told to try to wear compression sleeve. We discussed the likelihood of the return of this as well and that surgical intervention is still only 50% possible as well. Patient will come back on a regular basis if this continues again and will followup in 3 weeks for further evaluation.

## 2013-09-27 NOTE — Assessment & Plan Note (Signed)
stable overall by history and exam, recent data reviewed with pt, and pt to continue medical treatment as before,  to f/u any worsening symptoms or concerns, for med refill 

## 2013-09-27 NOTE — Progress Notes (Signed)
Subjective:    Patient ID: Ruth DistanceBrenda L Pinera, female    DOB: [birth year], 48 y.o.   MRN: [id number]  HPI    Here for wellness and f/u;  Overall doing ok;  Pt denies CP, worsening SOB, DOE, wheezing, orthopnea, PND, worsening LE edema, palpitations, dizziness or syncope.  Pt denies neurological change such as new headache, facial or extremity weakness.  Pt denies polydipsia, polyuria, or low sugar symptoms. Pt states overall good compliance with treatment and medications, good tolerability, and has been trying to follow lower cholesterol diet.  Pt denies worsening depressive symptoms, suicidal ideation or panic. No fever, night sweats, wt loss, loss of appetite, or other constitutional symptoms.  Pt states good ability with ADL's, has low fall risk, home safety reviewed and adequate, no other significant changes in hearing or vision, and active with exercise several times per wk.  Has not noticed any thyroid enlargement.  Needs refill clonazepam.  Does have recent left knee prepatellar bursitis s/p cortizone x 2 in the past, painful again in the past wk or so. No truama or fever Past Medical History  Diagnosis Date  . 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 FRACTURE, HAND, LEFT 11/29/2007  . FRACTURE, PELVIS 11/29/2007  . HYPERLIPIDEMIA 01/06/2010  . SINUSITIS- ACUTE-NOS 10/12/2008   Past Surgical History  Procedure Laterality Date  . Left hand surgery      Dr. Eulah PontMurphy  . Aspiration of left knee      reports that she has never smoked. She does not have any smokeless tobacco history on file. She reports that she drinks alcohol. She reports that she uses illicit drugs. family history includes Alcohol abuse in her father; Breast cancer in her mother; Hypertension in her father and mother. Allergies  Allergen Reactions  . Eggs Or Egg-Derived Products Nausea And Vomiting  . Glucosamine Forte [Nutritional Supplements] Rash  . Influenza Vaccines Rash  . Shellfish Allergy Hives and Rash   Current  Outpatient Prescriptions on File Prior to Visit  Medication Sig Dispense Refill  . Biotin 10 MG CAPS Take 1 capsule by mouth daily.      . clonazePAM (KLONOPIN) 0.5 MG tablet 1 tab by mouth in the am and 1-2 tabs in the pm as needed for stress and sleep  90 tablet  0  . ferrous sulfate 325 (65 FE) MG tablet Take 325 mg by mouth daily with breakfast.      . fish oil-omega-3 fatty acids 1000 MG capsule Take 1 g by mouth daily.      . Multiple Vitamin (MULTIVITAMIN) tablet Take 1 tablet by mouth daily.        . vitamin E 400 UNIT capsule Take 400 Units by mouth daily.       No current facility-administered medications on file prior to visit.   Review of Systems Constitutional: Negative for increased diaphoresis, other activity, appetite or other siginficant weight change  HENT: Negative for worsening hearing loss, ear pain, facial swelling, mouth sores and neck stiffness.   Eyes: Negative for other worsening pain, redness or visual disturbance.  Respiratory: Negative for shortness of breath and wheezing.   Cardiovascular: Negative for chest pain and palpitations.  Gastrointestinal: Negative for diarrhea, blood in stool, abdominal distention or other pain Genitourinary: Negative for hematuria, flank pain or change in urine volume.  Musculoskeletal: Negative for myalgias or other joint complaints.  Skin: Negative for color change and wound.  Neurological: Negative for syncope and numbness. other than noted Hematological: Negative for adenopathy. or  other swelling Psychiatric/Behavioral: Negative for hallucinations, self-injury, decreased concentration or other worsening agitation.      Objective:   Physical Exam BP 108/70  Pulse 77  Temp(Src) 98.9 F (37.2 C) (Oral)  Ht 5\' 11"  (1.803 m)  Wt 187 lb 6 oz (84.993 kg)  BMI 26.15 kg/m2  SpO2 99% VS noted,  Constitutional: Pt is oriented to person, place, and time. Appears well-developed and well-nourished.  Head: Normocephalic and  atraumatic.  Right Ear: External ear normal.  Left Ear: External ear normal.  Nose: Nose normal.  Mouth/Throat: Oropharynx is clear and moist.  Eyes: Conjunctivae and EOM are normal. Pupils are equal, round, and reactive to light.  Neck: Normal range of motion. Neck supple. No JVD present. No tracheal deviation present. thyroid with possible 1 cm or larger right nodule, NT Cardiovascular: Normal rate, regular rhythm, normal heart sounds and intact distal pulses.   Pulmonary/Chest: Effort normal and breath sounds without rales or wheezing  Abdominal: Soft. Bowel sounds are normal. NT. No HSM  Musculoskeletal: Normal range of motion. Exhibits no edema.  Lymphadenopathy:  Has no cervical adenopathy.  Neurological: Pt is alert and oriented to person, place, and time. Pt has normal reflexes. No cranial nerve deficit. Motor grossly intact Skin: Skin is warm and dry. No rash noted.  Left knee with 1-2+ prob prepatellar bursa swelling Psychiatric:  Has normal mood and affect. Behavior is normal.     Assessment & Plan:

## 2013-09-27 NOTE — Patient Instructions (Signed)
Please continue all other medications as before, and refills have been done if requested  - the klonopin  Please have the pharmacy call with any other refills you may need.  Please continue your efforts at being more active, low cholesterol diet, and weight control.  You are otherwise up to date with prevention measures today.  You will be contacted regarding the referral for: thyroid ultrasound  Please see Dr Katrinka BlazingSmith today for the left knee  Your EKG and LABS were OK today  Please return in 1 year for your yearly visit, or sooner if needed, with Lab testing done 3-5 days before

## 2013-09-27 NOTE — Progress Notes (Signed)
Pre visit review using our clinic review tool, if applicable. No additional management support is needed unless otherwise documented below in the visit note. 

## 2013-09-27 NOTE — Progress Notes (Signed)
  Tawana ScaleZach Smith D.O. [facility] [address] [place], [place] [address] Phone: [phone] Subjective:    I'm seeing this patient by the request  of:  Oliver BarreJames John, MD   CC: Knee swelling  YQM:[id number]:Subjective Ruth DistanceBrenda L Medina is a 48 y.o. female coming in with complaint of knee swelling. Patient has had knee swelling of his left knee intermittently for multiple years. Patient was seen orthopedic surgeon he did have an injection. Swelling is on the anterior aspect of the knee. Patient states that it does interfere with certain activity. Patient has to be very active and states that if she does deep squat to seems to make worse. Denies any radiation of pain or any numbness. Patient denies any clicking popping or giving out on her. States that it all seems to be localized in her back. She has been told before it was more of a bursitis. Patient was the pain severity is 5/10. No nighttime awakening.      Past medical history, social, surgical and family history all reviewed in electronic medical record.   Review of Systems: No headache, visual changes, nausea, vomiting, diarrhea, constipation, dizziness, abdominal pain, skin rash, fevers, chills, night sweats, weight loss, swollen lymph nodes, body aches, joint swelling, muscle aches, chest pain, shortness of breath, mood changes.   Objective Blood pressure 108/70, pulse 77, height 5\' 11"  (1.803 m), weight 187 lb (84.823 kg), SpO2 99.00%.  General: No apparent distress alert and oriented x3 mood and affect normal, dressed appropriately.  HEENT: Pupils equal, extraocular movements intact  Respiratory: Patient's speak in full sentences and does not appear short of breath  Cardiovascular: No lower extremity edema, non tender, no erythema  Skin: Warm dry intact with no signs of infection or rash on extremities or on axial skeleton.  Abdomen: Soft nontender  Neuro: Cranial nerves II through XII are intact, neurovascularly intact in all  extremities with 2+ DTRs and 2+ pulses.  Lymph: No lymphadenopathy of posterior or anterior cervical chain or axillae bilaterally.  Gait normal with good balance and coordination.  MSK:  Non tender with full range of motion and good stability and symmetric strength and tone of shoulders, elbows, wrist, hip,  and ankles bilaterally.  Knee: Inspection shows the patient does have a focal accumulation what appears to be a cystlike formation inferior and medial to the patella. Feels more firm. Palpation normal with no warmth, joint line tenderness, patellar tenderness, or condyle tenderness. ROM full in flexion and extension and lower leg rotation. Ligaments with solid consistent endpoints including ACL, PCL, LCL, MCL. Negative Mcmurray's, Apley's, and Thessalonian tests. Non painful patellar compression. Patellar glide mild crepitus. Patellar and quadriceps tendons unremarkable. Hamstring and quadriceps strength is normal.    MSK US performed of: Low left knee This study was ordered, performed, and interpreted by Terrilee FilesZach Smith D.O.  Knee: All structures visualized. Anteromedial, anterolateral, posteromedial, and posterolateral menisci unremarkable without tearing, fraying, effusion, or displacement. Patellar Tendon unremarkable on long and transverse views without effusion. No abnormality of prepatellar bursa. LCL and MCL unremarkable on long and transverse views. No abnormality of origin of medial or lateral head of the gastrocnemius. Superficial patient does have what appears to be a ganglion cyst it is just inferior and medial to the patellar. No fluid noted.  IMPRESSION:  Superficial ganglion cyst     Impression and Recommendations:     This case required medical decision making of moderate complexity.

## 2013-09-27 NOTE — Assessment & Plan Note (Signed)

## 2013-09-27 NOTE — Assessment & Plan Note (Signed)
?   Bursitis recurrent, for sport med/dr smith referral now

## 2013-10-10 ENCOUNTER — Ambulatory Visit
Admission: RE | Admit: 2013-10-10 | Discharge: 2013-10-10 | Disposition: A | Discharge status: Home / Self Care | Payer: 59 | Source: Ambulatory Visit | Attending: Internal Medicine | Admitting: Internal Medicine

## 2013-10-10 ENCOUNTER — Encounter (HOSPITAL_COMMUNITY): Payer: Self-pay | Admitting: Emergency Medicine

## 2013-10-10 ENCOUNTER — Emergency Department (HOSPITAL_COMMUNITY)
Admission: EM | Admit: 2013-10-10 | Discharge: 2013-10-10 | Disposition: A | Discharge status: Home / Self Care | Payer: 59 | Source: Home / Self Care | Attending: Family Medicine | Admitting: Family Medicine

## 2013-10-10 DIAGNOSIS — E041 Nontoxic single thyroid nodule: Secondary | ICD-10-CM

## 2013-10-10 DIAGNOSIS — J069 Acute upper respiratory infection, unspecified: Secondary | ICD-10-CM

## 2013-10-10 MED ORDER — IPRATROPIUM BROMIDE 0.06 % NA SOLN: 2.0000 | Freq: Four times a day (QID) | NASAL | Status: DC

## 2013-10-10 MED ORDER — HYDROCOD POLST-CHLORPHEN POLST 10-8 MG/5ML PO LQCR: 5.0000 mL | Freq: Two times a day (BID) | ORAL | Status: DC | PRN

## 2013-10-10 NOTE — Discharge Instructions (Signed)
Drink plenty of fluids as discussed, use medicine as prescribed, and mucinex or delsym for cough. Return or see your doctor if further problems °

## 2013-10-10 NOTE — ED Provider Notes (Signed)
CSN: [id number]     Arrival date & time 10/10/13  [id number] History   First MD Initiated Contact with Patient 10/10/13 [phone]     Chief Complaint  Patient presents with  . URI   (Consider location/radiation/quality/duration/timing/severity/associated sxs/prior Treatment) Patient is a 48 y.o. female presenting with URI. The history is provided by the patient.  URI Presenting symptoms: congestion, cough and rhinorrhea   Presenting symptoms: no fever   Severity:  Mild Onset quality:  Gradual Duration:  5 days Progression:  Unchanged (I think I have a really bad cold., just back from trip to [place] Chronicity:  New Associated symptoms: sneezing   Associated symptoms: no wheezing   Risk factors: recent travel     Past Medical History  Diagnosis Date  . 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 FRACTURE, HAND, LEFT 11/29/2007  . FRACTURE, PELVIS 11/29/2007  . HYPERLIPIDEMIA 01/06/2010  . SINUSITIS- ACUTE-NOS 10/12/2008   Past Surgical History  Procedure Laterality Date  . Left hand surgery      Dr. Eulah PontMurphy  . Aspiration of left knee     Family History  Problem Relation Age of Onset  . Breast cancer Mother   . Hypertension Mother   . Alcohol abuse Father   . Hypertension Father    History  Substance Use Topics  . Smoking status: Never Smoker   . Smokeless tobacco: Not on file  . Alcohol Use: Yes   OB History   Grav Para Term Preterm Abortions TAB SAB Ect Mult Living   0              Review of Systems  Constitutional: Negative.  Negative for fever.  HENT: Positive for congestion, postnasal drip, rhinorrhea and sneezing.   Respiratory: Positive for cough. Negative for wheezing.     Allergies  Eggs or egg-derived products; Glucosamine forte; Influenza vaccines; and Shellfish allergy  Home Medications   Prior to Admission medications   Medication Sig Start Date End Date Taking? Authorizing Provider  aspirin 81 MG tablet Take 81 mg by mouth daily.    Historical Provider, MD   Biotin 10 MG CAPS Take 1 capsule by mouth daily.    Historical Provider, MD  clonazePAM (KLONOPIN) 0.5 MG tablet 1 tab by mouth in the am and 1-2 tabs in the pm as needed for stress and sleep 09/27/13   Corwin LevinsJames W John, MD  ferrous sulfate 325 (65 FE) MG tablet Take 325 mg by mouth daily with breakfast.    Historical Provider, MD  fish oil-omega-3 fatty acids 1000 MG capsule Take 1 g by mouth daily.    Historical Provider, MD  Multiple Vitamin (MULTIVITAMIN) tablet Take 1 tablet by mouth daily.      Historical Provider, MD  vitamin E 400 UNIT capsule Take 400 Units by mouth daily.    Historical Provider, MD   BP 135/66  Pulse 83  Temp(Src) 99.3 F (37.4 C) (Oral)  Resp 12  SpO2 100%  LMP 10/10/2013 Physical Exam  Nursing note and vitals reviewed. Constitutional: She is oriented to person, place, and time. She appears well-developed and well-nourished.  HENT:  Right Ear: External ear normal.  Left Ear: External ear normal.  Mouth/Throat: Oropharynx is clear and moist.  Eyes: Conjunctivae are normal. Pupils are equal, round, and reactive to light.  Neck: Normal range of motion. Neck supple.  Pulmonary/Chest: Effort normal and breath sounds normal.  Lymphadenopathy:    She has no cervical adenopathy.  Neurological: She is alert and oriented  to person, place, and time.  Skin: Skin is warm and dry.    ED Course  Procedures (including critical care time) Labs Review Labs Reviewed - No data to display  Imaging Review No results found.   MDM   1. URI (upper respiratory infection)        Linna HoffJames D Subhan Hoopes, MD 10/10/13 [phone]

## 2013-10-10 NOTE — ED Notes (Signed)
Pt  States     She  Wants  To  Speak  To  The  Doctor  Again       She  Thinks     She  Needs  An  Anti  Biotic         Dr  Artis FlockKindl   Came  Back  And  Spoke  To  Her     Subsequently  She  Refused  The    Rx        rx    discarded  Per  pts  Request    In her  Presence

## 2013-10-10 NOTE — ED Notes (Signed)
Pt  reports  Symptoms  Of  Cough  Congested   Hoarse   With  sorethroat  As  Well  With  Symptoms  X  5  Days  unreleived  By OTC  MEDS      SITTING  UPRIGHT  ON  EXAM TABLE  SPEAKING IN  COMPLETE  SENTANCES

## 2013-10-19 ENCOUNTER — Encounter: Payer: Self-pay | Admitting: Family Medicine

## 2013-10-20 ENCOUNTER — Ambulatory Visit (INDEPENDENT_AMBULATORY_CARE_PROVIDER_SITE_OTHER): Payer: 59 | Admitting: Family Medicine

## 2013-10-20 ENCOUNTER — Encounter: Payer: Self-pay | Admitting: Family Medicine

## 2013-10-20 VITALS — BP 124/82 | HR 91 | Ht 71.0 in | Wt 186.0 lb

## 2013-10-20 DIAGNOSIS — R635 Abnormal weight gain: Secondary | ICD-10-CM

## 2013-10-20 NOTE — Patient Instructions (Addendum)
Wake up 530- 2 glasses of water immediatly.  Then 100-200 calories of something (yogurt, apple, applesauce, nuts) Add nuts to breakfast.  Then 03-1029 snack- about 100-200 calories. Yogurt. Nuts again, protein bar something like that.  Lunch looks good.  Then snack before workout. This is where simple carbs are ok.  Fruit, honey, etc.  Then within 30 minutes of working out you need a 4:1 ration of carbs to protein.  Example Chocolate milk. Light muscle milk. Peanut butter with  Whey protein isolate.  My fitness pal is free.  Calories should be 1700-2200 daily.  50% carbs, 25% fat and 25% protein.  Weigh self only 1 time a week.  Come back in 4 weeks.

## 2013-10-20 NOTE — Progress Notes (Signed)
  [facility] [address] [place], [place] [address] Phone: [phone] Subjective:     CC: Knee swelling follow up, discuss weight loss.   [id number] Ruth Medina is a 48 y.o. female coming in with complaint of knee swelling. Patient was seen previously and did have a diagnosis of a ganglion cyst. Patient had aspiration of the cyst as well as an injection of Kenalog previously. Patient states the compression sleeve and the icing has helped. Patient states that the swelling has not returned.   Patient is also here for nutrition counseling. Patient did keep a 3-D diarrhea. We did go over patient 3 date the area. Patient has been eating 3 meals a day but has a space from 12 PM to 7:30 PM without any food. Patient does work out at night for approximately one hour nightly. Approximation patient was taken in 1600 calories daily. Patient was noting a negative of 400 calories daily. Patient does not have any significant weight loss. Filed Weights   10/20/13 1258  Weight: 186 lb (84.369 kg)        Past medical history, social, surgical and family history all reviewed in electronic medical record.   Review of Systems: No headache, visual changes, nausea, vomiting, diarrhea, constipation, dizziness, abdominal pain, skin rash, fevers, chills, night sweats, weight loss, swollen lymph nodes, body aches, joint swelling, muscle aches, chest pain, shortness of breath, mood changes.   Objective Blood pressure 124/82, pulse 91, height 5\' 11"  (1.803 m), weight 186 lb (84.369 kg), last menstrual period 10/10/2013, SpO2 99.00%.  General: No apparent distress alert and oriented x3 mood and affect normal, dressed appropriately.  HEENT: Pupils equal, extraocular movements intact  Respiratory: Patient's speak in full sentences and does not appear short of breath  Cardiovascular: No lower extremity edema, non tender, no erythema  Skin: Warm dry intact with no signs of  infection or rash on extremities or on axial skeleton.  Abdomen: Soft nontender  Neuro: Cranial nerves II through XII are intact, neurovascularly intact in all extremities with 2+ DTRs and 2+ pulses.  Lymph: No lymphadenopathy of posterior or anterior cervical chain or axillae bilaterally.  Gait normal with good balance and coordination.  MSK:  Non tender with full range of motion and good stability and symmetric strength and tone of shoulders, elbows, wrist, hip,  and ankles bilaterally.  Knee: Left Inspection shows the patient does not have fullness that was appreciated last visit Palpation normal with no warmth, joint line tenderness, patellar tenderness, or condyle tenderness. ROM full in flexion and extension and lower leg rotation. Ligaments with solid consistent endpoints including ACL, PCL, LCL, MCL. Negative Mcmurray's, Apley's, and Thessalonian tests. Non painful patellar compression. Patellar glide mild crepitus. Patellar and quadriceps tendons unremarkable. Hamstring and quadriceps strength is normal.      Impression and Recommendations:     This case required medical decision making of moderate complexity.

## 2013-10-20 NOTE — Assessment & Plan Note (Signed)
We discussed with patient at great length. Patient was very fixated on number. Patient was fixated underweight number as well the calories and in the calories out. Discussed with patient we need to change his and kind of change timing of food as well as amounts. We discussed how this can be very beneficial. Patient is going to try some changes and we will get in patient instructions. Patient and will come back again in 4 weeks for further evaluation.  Spent greater than 25 minutes with patient face-to-face and had greater than 50% of counseling including as described above in assessment and plan.

## 2013-11-17 ENCOUNTER — Encounter: Payer: Self-pay | Admitting: Family Medicine

## 2013-11-17 ENCOUNTER — Ambulatory Visit (INDEPENDENT_AMBULATORY_CARE_PROVIDER_SITE_OTHER): Payer: 59 | Admitting: Family Medicine

## 2013-11-17 VITALS — BP 130/82 | HR 88 | Ht 71.0 in | Wt 186.0 lb

## 2013-11-17 DIAGNOSIS — R635 Abnormal weight gain: Secondary | ICD-10-CM

## 2013-11-17 NOTE — Progress Notes (Signed)
  Ruth ScaleZach Medina D.O. [facility] [address] [place], [place] [address] Phone: [phone] Subjective:     CC: Knee swelling follow up, discuss weight loss.   BJY:NWGNFAOZHYHPI:Subjective Ruth DistanceBrenda L Medina is a 48 y.o. female coming in with complaint of knee swelling. States that the knee is feeling significantly better. Patient is having no pain we soreness after running for one day. This is not stopping her from any activities.  Patient is feeling much better. Patient continues to lift weights. Patient did gain one-pound which makes it frustrating as she states that she has never felt this good in has never had this definition of her body. Patient states that she has not had as much energy. Patient did make the different transitions with the nutrition and overall do not believe better she feels. Patient does have trouble though with the number that she sees on a [facility]   Filed Weights   11/17/13 1317  Weight: 186 lb (84.369 kg)        Past medical history, social, surgical and family history all reviewed in electronic medical record.   Review of Systems: No headache, visual changes, nausea, vomiting, diarrhea, constipation, dizziness, abdominal pain, skin rash, fevers, chills, night sweats, weight loss, swollen lymph nodes, body aches, joint swelling, muscle aches, chest pain, shortness of breath, mood changes.   Objective Blood pressure 130/82, pulse 88, height 5\' 11"  (1.803 m), weight 186 lb (84.369 kg), SpO2 99.00%.  General: No apparent distress alert and oriented x3 mood and affect normal, dressed appropriately. Patient noticed he does have better definition to her arms HEENT: Pupils equal, extraocular movements intact  Respiratory: Patient's speak in full sentences and does not appear short of breath  Cardiovascular: No lower extremity edema, non tender, no erythema  Skin: Warm dry intact with no signs of infection or rash on extremities or on axial skeleton.  Abdomen: Soft  nontender  Neuro: Cranial nerves II through XII are intact, neurovascularly intact in all extremities with 2+ DTRs and 2+ pulses.  Lymph: No lymphadenopathy of posterior or anterior cervical chain or axillae bilaterally.  Gait normal with good balance and coordination.  MSK:  Non tender with full range of motion and good stability and symmetric strength and tone of shoulders, elbows, wrist, hip,  and ankles bilaterally.  Knee: Left Inspection is unremarkable Palpation normal with no warmth, joint line tenderness, patellar tenderness, or condyle tenderness. ROM full in flexion and extension and lower leg rotation. Ligaments with solid consistent endpoints including ACL, PCL, LCL, MCL. Negative Mcmurray's, Apley's, and Thessalonian tests. Non painful patellar compression. Patellar glide mild crepitus. Patellar and quadriceps tendons unremarkable. Hamstring and quadriceps strength is normal.      Impression and Recommendations:     This case required medical decision making of moderate complexity.

## 2013-11-17 NOTE — Patient Instructions (Addendum)
You are doing [place] You might be my poster child.  If we are concern of the number then consider decreasing one snack and increasing cardio but I wouldn't Also increasing amount of vegetables could help. Broccoli, cauliflower, celery, eggplant.  Continue the protein Get those labs I am interested.  See you when you need me.  Send me an email in a month and tell me weight, and how you feel.

## 2013-11-17 NOTE — Assessment & Plan Note (Addendum)
Discussed with patient again at length. Overall she is doing tremendously better and does feel better with more energy. Patient will continue the over-the-counter medications we discussed continuing the protein supplementation. Patient is concerned that this could be potentially water weights we decided to try to increase the vegetables. Patient is to make these changes and can follow up on an as-needed basis. Patient also is going to get labs and I am hoping that she sees improvement in the labs then she will be more optimistic about her weight is greater than 170.  Spent greater than 25 minutes with patient face-to-face and had greater than 50% of counseling including as described above in assessment and plan.

## 2013-11-24 ENCOUNTER — Other Ambulatory Visit: Payer: Self-pay | Admitting: Obstetrics and Gynecology

## 2013-11-24 DIAGNOSIS — Z1231 Encounter for screening mammogram for malignant neoplasm of breast: Secondary | ICD-10-CM

## 2013-11-29 DIAGNOSIS — R9230 Dense breasts, unspecified: Secondary | ICD-10-CM | POA: Insufficient documentation

## 2013-11-29 DIAGNOSIS — R922 Inconclusive mammogram: Secondary | ICD-10-CM | POA: Insufficient documentation

## 2013-12-25 ENCOUNTER — Ambulatory Visit
Admission: RE | Admit: 2013-12-25 | Discharge: 2013-12-25 | Disposition: A | Discharge status: Home / Self Care | Payer: 59 | Source: Ambulatory Visit | Attending: Obstetrics and Gynecology | Admitting: Obstetrics and Gynecology

## 2013-12-25 DIAGNOSIS — Z1231 Encounter for screening mammogram for malignant neoplasm of breast: Secondary | ICD-10-CM

## 2014-01-16 ENCOUNTER — Ambulatory Visit (INDEPENDENT_AMBULATORY_CARE_PROVIDER_SITE_OTHER): Payer: Commercial Managed Care - PPO | Admitting: Family Medicine

## 2014-01-16 VITALS — BP 126/80 | HR 72 | Temp 98.0°F | Resp 17 | Ht 71.0 in | Wt 190.0 lb

## 2014-01-16 DIAGNOSIS — IMO0002 Reserved for concepts with insufficient information to code with codable children: Secondary | ICD-10-CM

## 2014-01-16 DIAGNOSIS — L255 Unspecified contact dermatitis due to plants, except food: Secondary | ICD-10-CM

## 2014-01-16 DIAGNOSIS — L03119 Cellulitis of unspecified part of limb: Secondary | ICD-10-CM

## 2014-01-16 DIAGNOSIS — L237 Allergic contact dermatitis due to plants, except food: Secondary | ICD-10-CM

## 2014-01-16 MED ORDER — PREDNISONE 20 MG PO TABS: 40.0000 mg | ORAL_TABLET | Freq: Every day | ORAL | Status: DC

## 2014-01-16 MED ORDER — CEPHALEXIN 500 MG PO CAPS: 500.0000 mg | ORAL_CAPSULE | Freq: Three times a day (TID) | ORAL | Status: DC

## 2014-01-16 NOTE — Progress Notes (Signed)
Patient ID: Ruth Medina MRN: [id number], DOB: 1966/01/14, 48 y.o. Date of Encounter: 01/16/2014, 4:09 PM  Primary Physician: Oliver Barre, MD  Chief Complaint: Pruritic rash  HPI: 48 y.o. year old female with presents with 5 day history of mildly erythematous pruritic rash along forearms. Patient was doing yard work prior to the development of the rash. Known poison ivy in the vicinity. Has not yet washed all clothing or linens that have been exposed. Lesions now weeping clear fluid. Has tried vinegar.  Patient is otherwise doing well without issues or complaints.  Past Medical History  Diagnosis Date  . 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 FRACTURE, HAND, LEFT 11/29/2007  . FRACTURE, PELVIS 11/29/2007  . HYPERLIPIDEMIA 01/06/2010  . SINUSITIS- ACUTE-NOS 10/12/2008     Home Meds: Prior to Admission medications   Medication Sig Start Date End Date Taking? Authorizing Provider  aspirin 81 MG tablet Take 81 mg by mouth daily.   Yes Historical Provider, MD  Biotin 10 MG CAPS Take 1 capsule by mouth daily.   Yes Historical Provider, MD  clonazePAM (KLONOPIN) 0.5 MG tablet 1 tab by mouth in the am and 1-2 tabs in the pm as needed for stress and sleep 09/27/13  Yes Corwin Levins, MD  ferrous sulfate 325 (65 FE) MG tablet Take 325 mg by mouth daily with breakfast.   Yes Historical Provider, MD  fish oil-omega-3 fatty acids 1000 MG capsule Take 1 g by mouth daily.   Yes Historical Provider, MD  Multiple Vitamin (MULTIVITAMIN) tablet Take 1 tablet by mouth daily.     Yes Historical Provider, MD  vitamin E 400 UNIT capsule Take 400 Units by mouth daily.   Yes Historical Provider, MD  cephALEXin (KEFLEX) 500 MG capsule Take 1 capsule (500 mg total) by mouth 3 (three) times daily. 01/16/14   Elvina Sidle, MD  predniSONE (DELTASONE) 20 MG tablet Take 2 tablets (40 mg total) by mouth daily. 01/16/14   Elvina Sidle, MD    Allergies:  Allergies  Allergen Reactions  . Eggs Or Egg-Derived Products  Nausea And Vomiting  . Glucosamine Forte [Nutritional Supplements] Rash  . Influenza Vaccines Rash  . Shellfish Allergy Hives and Rash    History   Social History  . Marital Status: Single    Spouse Name: N/A    Number of Children: N/A  . Years of Education: N/A   Occupational History  . Not on file.   Social History Main Topics  . Smoking status: Never Smoker   . Smokeless tobacco: Not on file  . Alcohol Use: No  . Drug Use: No  . Sexual Activity: No   Other Topics Concern  . Not on file   Social History Narrative  . No narrative on file     Review of Systems: Constitutional: negative for chills, fever, night sweats, weight changes, or fatigue  HEENT: negative for vision changes, hearing loss, congestion, rhinorrhea, ST, epistaxis, or sinus pressure Cardiovascular: negative for chest pain or palpitations Respiratory: negative for hemoptysis, wheezing, shortness of breath, or cough Abdominal: negative for abdominal pain, nausea, vomiting, diarrhea, or constipation Dermatological: see above Neurologic: negative for headache, dizziness, or syncope   Physical Exam: Blood pressure 126/80, pulse 72, temperature 98 F (36.7 C), temperature source Oral, resp. rate 17, height 5\' 11"  (1.803 m), weight 190 lb (86.183 kg), last menstrual period 01/09/2014, SpO2 100.00%., Body mass index is 26.51 kg/(m^2). General: Well developed, well nourished, in no acute distress. Head: Normocephalic, atraumatic, eyes  without discharge, sclera non-icteric, nares are without discharge. Bilateral auditory canals clear, TM's are without perforation, pearly grey and translucent with reflective cone of light bilaterally. Oral cavity moist, posterior pharynx without exudate, erythema, peritonsillar abscess, or post nasal drip.  Neck: Supple. No thyromegaly. Full ROM. No lymphadenopathy. Lungs: Clear bilaterally to auscultation without wheezes, rales, or rhonchi. Breathing is unlabored. Heart: RRR  with S1 S2. No murmurs, rubs, or gallops appreciated. Msk:  Strength and tone normal for age. Extremities/Skin: Warm and dry. Multiple vesicular weeping lesions along both forearms consistent with poison ivy. Possible secondary infections present. No clubbing with warmth and induration both forearms. No edema.  Neuro: Alert and oriented X 3. Moves all extremities spontaneously. Gait is normal. CNII-XII grossly in tact. Psych:  Responds to questions appropriately with a normal affect.   Labs:   ASSESSMENT AND PLAN:  48 y.o. year old female with poison ivy involving forearms Poison ivy dermatitis - Plan: predniSONE (DELTASONE) 20 MG tablet, cephALEXin (KEFLEX) 500 MG capsule  Cellulitis of upper extremity, unspecified laterality - Plan: cephALEXin (KEFLEX) 500 MG capsule   -Zyrtec -Zantac -Benadryl -Wash all contaminated clothes and linens -RTC 10 days if symptoms persist, sooner if they worsen   Signed, Elvina Sidle, MD 01/16/2014 4:09 PM

## 2014-01-16 NOTE — Patient Instructions (Signed)
Cellulitis Cellulitis is an infection of the skin and the tissue beneath it. The infected area is usually red and tender. Cellulitis occurs most often in the arms and lower legs.  CAUSES  Cellulitis is caused by bacteria that enter the skin through cracks or cuts in the skin. The most common types of bacteria that cause cellulitis are staphylococci and streptococci. SIGNS AND SYMPTOMS   Redness and warmth.  Swelling.  Tenderness or pain.  Fever. DIAGNOSIS  Your health care provider can usually determine what is wrong based on a physical exam. Blood tests may also be done. TREATMENT  Treatment usually involves taking an antibiotic medicine. HOME CARE INSTRUCTIONS   Take your antibiotic medicine as directed by your health care provider. Finish the antibiotic even if you start to feel better.  Keep the infected arm or leg elevated to reduce swelling.  Apply a warm cloth to the affected area up to 4 times per day to relieve pain.  Take medicines only as directed by your health care provider.  Keep all follow-up visits as directed by your health care provider. SEEK MEDICAL CARE IF:   You notice red streaks coming from the infected area.  Your red area gets larger or turns dark in color.  Your bone or joint underneath the infected area becomes painful after the skin has healed.  Your infection returns in the same area or another area.  You notice a swollen bump in the infected area.  You develop new symptoms.  You have a fever. SEEK IMMEDIATE MEDICAL CARE IF:   You feel very sleepy.  You develop vomiting or diarrhea.  You have a general ill feeling (malaise) with muscle aches and pains. MAKE SURE YOU:   Understand these instructions.  Will watch your condition.  Will get help right away if you are not doing well or get worse. Document Released: 02/25/2005 Document Revised: 10/02/2013 Document Reviewed: 08/03/2011 ExitCare Patient Information 2015 [employer].  This information is not intended to replace advice given to you by your health care provider. Make sure you discuss any questions you have with your health care provider. Poison Ivy Poison ivy is a inflammation of the skin (contact dermatitis) caused by touching the allergens on the leaves of the ivy plant following previous exposure to the plant. The rash usually appears 48 hours after exposure. The rash is usually bumps (papules) or blisters (vesicles) in a linear pattern. Depending on your own sensitivity, the rash may simply cause redness and itching, or it may also progress to blisters which may break open. These must be well cared for to prevent secondary bacterial (germ) infection, followed by scarring. Keep any open areas dry, clean, dressed, and covered with an antibacterial ointment if needed. The eyes may also get puffy. The puffiness is worst in the morning and gets better as the day progresses. This dermatitis usually heals without scarring, within 2 to 3 weeks without treatment. HOME CARE INSTRUCTIONS  Thoroughly wash with soap and water as soon as you have been exposed to poison ivy. You have about one half hour to remove the plant resin before it will cause the rash. This washing will destroy the oil or antigen on the skin that is causing, or will cause, the rash. Be sure to wash under your fingernails as any plant resin there will continue to spread the rash. Do not rub skin vigorously when washing affected area. Poison ivy cannot spread if no oil from the plant remains on your body.   A rash that has progressed to weeping sores will not spread the rash unless you have not washed thoroughly. It is also important to wash any clothes you have been wearing as these may carry active allergens. The rash will return if you wear the unwashed clothing, even several days later. Avoidance of the plant in the future is the best measure. Poison ivy plant can be recognized by the number of leaves. Generally,  poison ivy has three leaves with flowering branches on a single stem. Diphenhydramine may be purchased over the counter and used as needed for itching. Do not drive with this medication if it makes you drowsy.Ask your caregiver about medication for children. SEEK MEDICAL CARE IF:  Open sores develop.  Redness spreads beyond area of rash.  You notice purulent (pus-like) discharge.  You have increased pain.  Other signs of infection develop (such as fever). Document Released: 05/15/2000 Document Revised: 08/10/2011 Document Reviewed: 10/26/2008 ExitCare Patient Information 2015 [employer]. This information is not intended to replace advice given to you by your health care provider. Make sure you discuss any questions you have with your health care provider.   

## 2014-03-16 ENCOUNTER — Other Ambulatory Visit: Payer: Self-pay

## 2014-07-07 IMAGING — US US SOFT TISSUE HEAD/NECK
1 series · 14 of 25 positions shown · non-contrast
Comparison: None.

CLINICAL DATA: Question of right thyroid nodule

EXAM:
THYROID ULTRASOUND
TECHNIQUE: Ultrasound examination of the thyroid gland and adjacent soft
tissues was performed.

[Series 1: us soft tissue head/neck · 0.05mm/px · 14 of 67 slices shown]
[im 1/67]
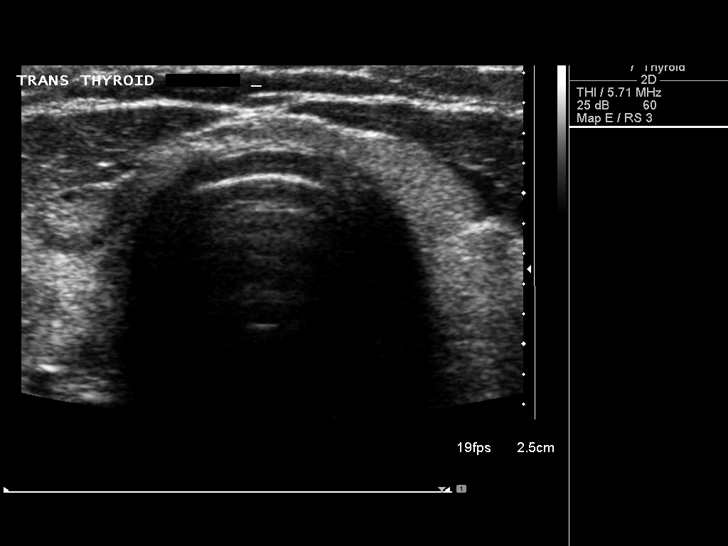
[im 6/67]
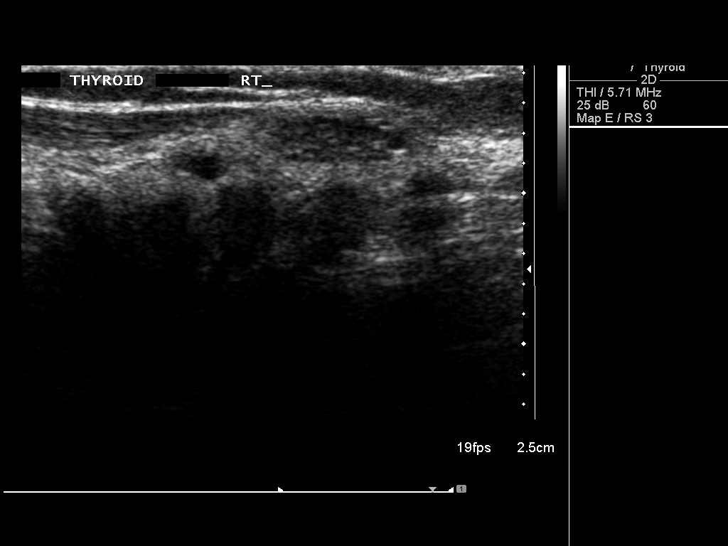
[im 12/67]
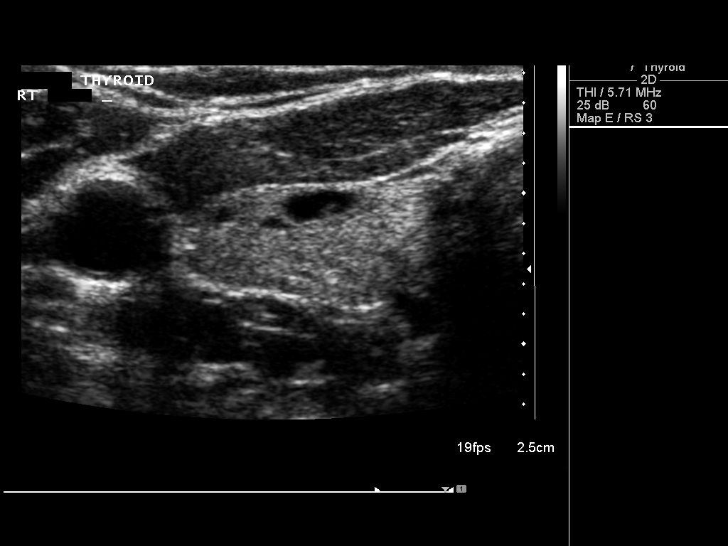
[im 17/67]
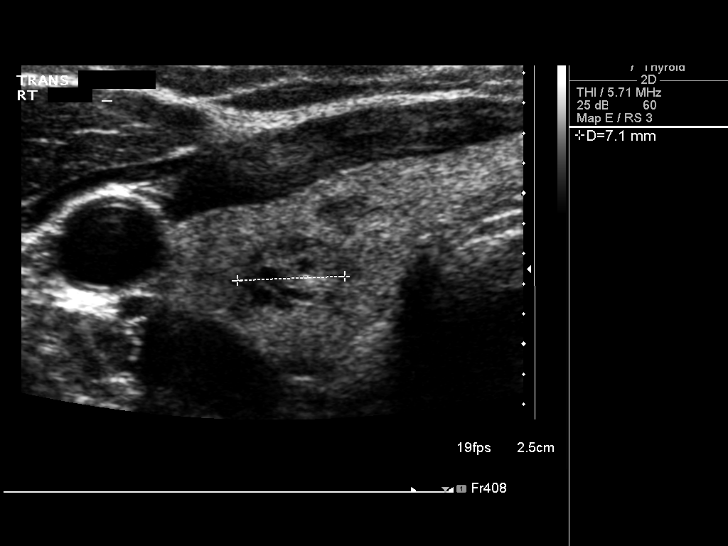
[im 23/67]
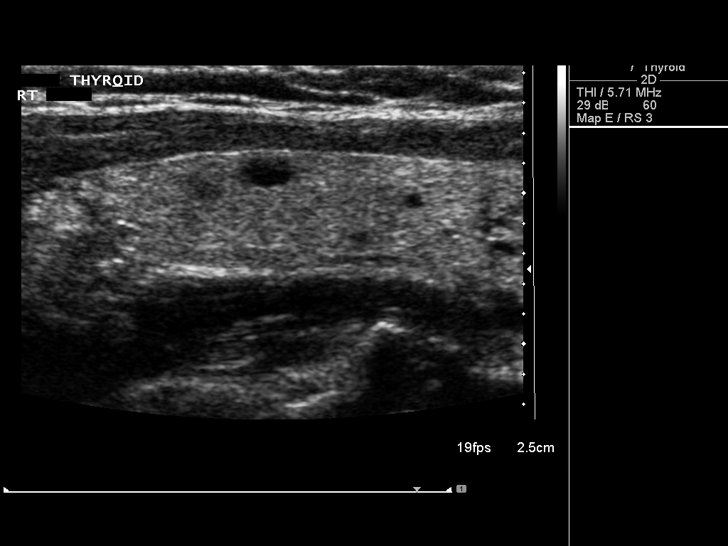
[im 25/67]
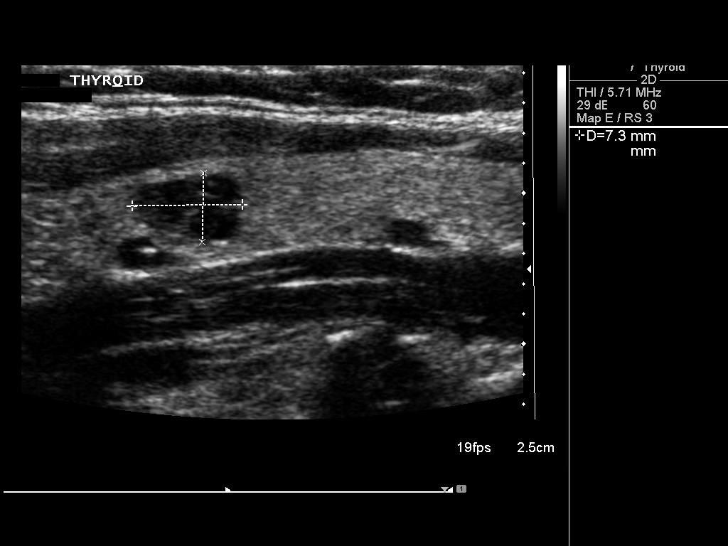
[im 31/67]
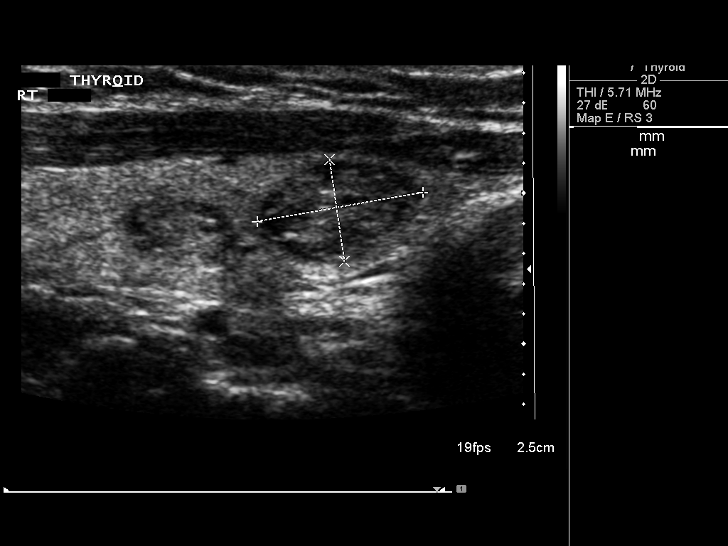
[im 36/67]
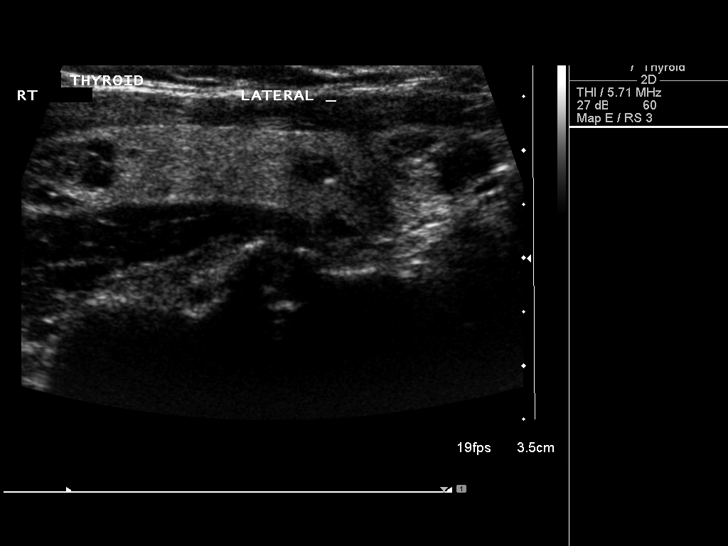
[im 42/67]
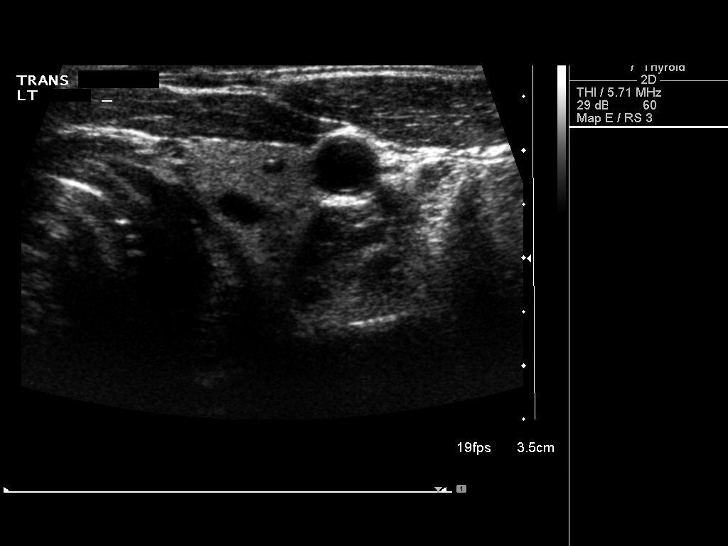
[im 45/67]
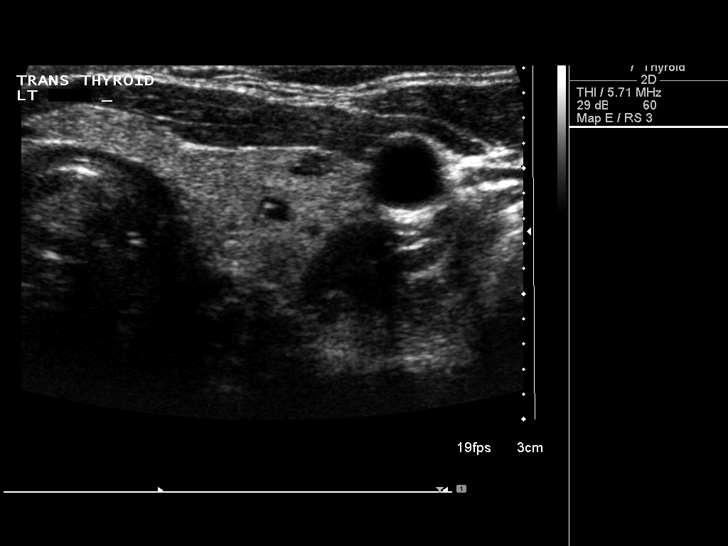
[im 50/67]
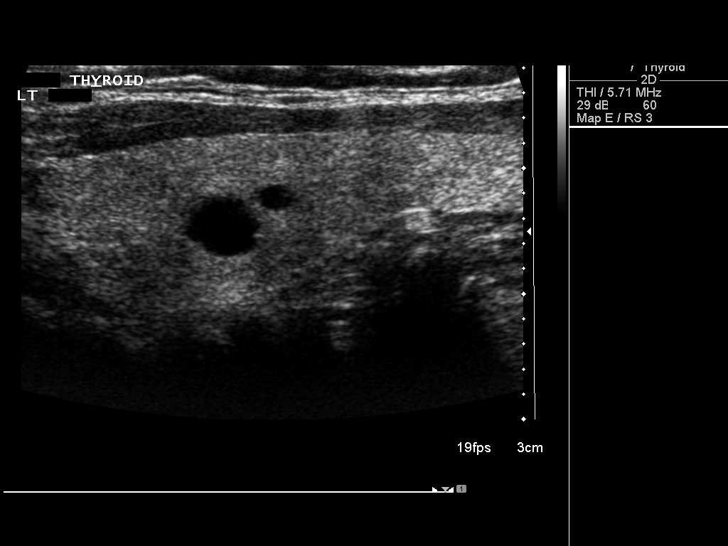
[im 56/67]
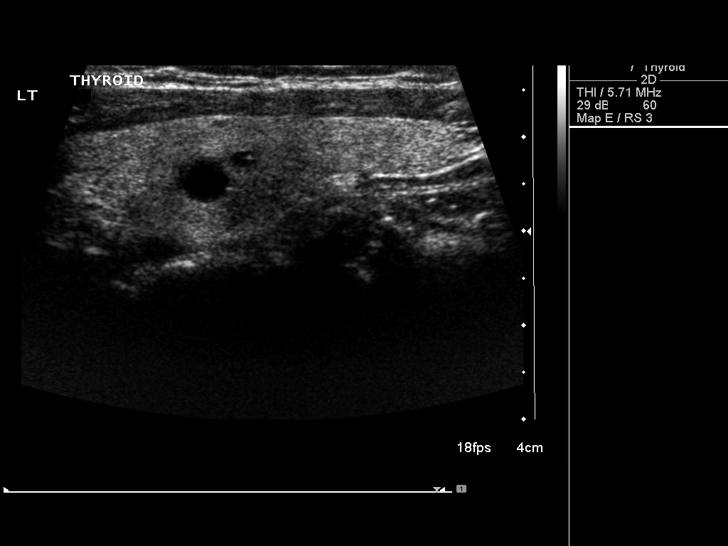
[im 61/67]
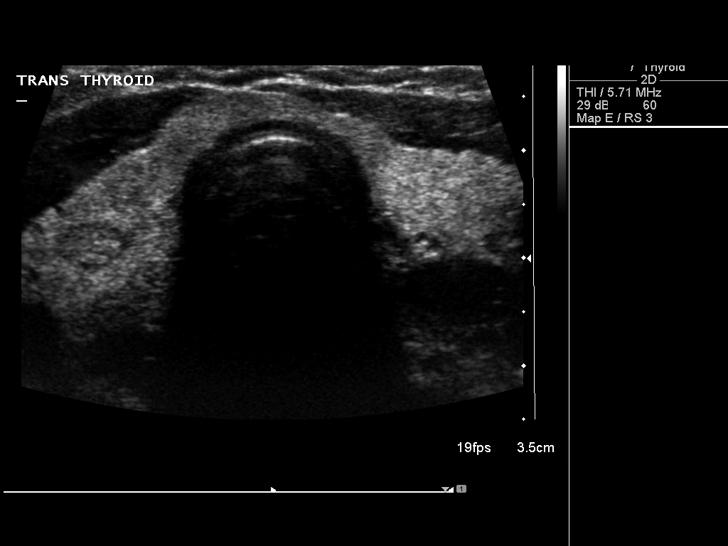
[im 67/67]
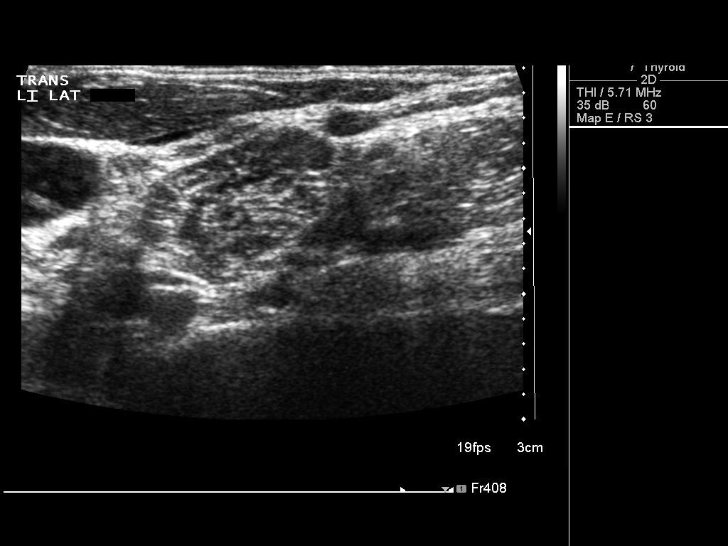

[14 of 25 positions shown; findings below may reference images not displayed]

FINDINGS: Right thyroid lobe

Measurements: 4.4 x 1.0 x 1.5 cm.. Multiple right thyroid nodules
are present. The largest nodule is in the lower lobe and is
primarily solid measuring 1.2 x 0.7 x 1.1 cm with calcifications.
Multiple additional nodules are present of no more than 9 mm in
diameter, largely hypoechoic.

Left thyroid lobe

Measurements: 4.7 x 1.3 x 1.5 cm.. Small hypoechoic nodules are
present, none larger than 9 mm in diameter.

Isthmus

Thickness: 4.1 mm in thickness..  No nodules visualized.

Lymphadenopathy

None visualized.
IMPRESSION: Multiple small primarily hypoechoic nodules are present with the
largest solid nodule in the lower pole of the right lobe of 1.2 cm
with microcalcifications. Consider followup ultrasound in 1 year in
view of the appearance of this nodule.

## 2014-09-21 ENCOUNTER — Telehealth: Payer: Self-pay | Admitting: Internal Medicine

## 2014-09-21 MED ORDER — DICLOFENAC SODIUM 75 MG PO TBEC: 75.0000 mg | DELAYED_RELEASE_TABLET | Freq: Two times a day (BID) | ORAL | Status: DC

## 2014-09-21 NOTE — Telephone Encounter (Signed)
Done erx 

## 2014-09-21 NOTE — Telephone Encounter (Signed)
Patient is requesting diclofenac for knee pain.  She states this is something she has taken before.  Do not see on med list or history.  Patient might not be sure on med. Patient uses [facility].

## 2014-09-21 NOTE — Telephone Encounter (Signed)
Notified pt md sent to pharmacy.../lmb 

## 2014-11-07 ENCOUNTER — Telehealth: Payer: Self-pay | Admitting: Internal Medicine

## 2014-11-07 NOTE — Telephone Encounter (Signed)
Patients physical is not until end of Aug and her lab work has expired. Can you put in new orders for her so she can get her lab work done before her appointment.

## 2014-11-07 NOTE — Addendum Note (Signed)
Addended by: Anselm JunglingBONYUN-DEBOURGH, Waleed Dettman M on: 11/07/2014 10:21 AM   Modules accepted: Orders

## 2014-11-19 ENCOUNTER — Encounter: Payer: Self-pay | Admitting: Internal Medicine

## 2014-12-21 ENCOUNTER — Telehealth: Payer: Self-pay | Admitting: Internal Medicine

## 2014-12-21 NOTE — Telephone Encounter (Signed)
Received records from [facility] forwarded to Dr. Oliver Barre 12/21/14 fbg.

## 2015-01-25 ENCOUNTER — Other Ambulatory Visit (INDEPENDENT_AMBULATORY_CARE_PROVIDER_SITE_OTHER): Payer: 59

## 2015-01-25 DIAGNOSIS — Z0000 Encounter for general adult medical examination without abnormal findings: Secondary | ICD-10-CM | POA: Diagnosis not present

## 2015-01-25 LAB — URINALYSIS, ROUTINE W REFLEX MICROSCOPIC
BILIRUBIN URINE: NEGATIVE
HGB URINE DIPSTICK: NEGATIVE
Ketones, ur: NEGATIVE
LEUKOCYTES UA: NEGATIVE
NITRITE: NEGATIVE
Specific Gravity, Urine: 1.01 (ref 1.000–1.030)
TOTAL PROTEIN, URINE-UPE24: NEGATIVE
URINE GLUCOSE: NEGATIVE
UROBILINOGEN UA: 0.2 (ref 0.0–1.0)
pH: 5.5 (ref 5.0–8.0)

## 2015-01-25 LAB — CBC WITH DIFFERENTIAL/PLATELET
BASOS ABS: 0 10*3/uL (ref 0.0–0.1)
Basophils Relative: 0.5 % (ref 0.0–3.0)
EOS PCT: 6.1 % — AB (ref 0.0–5.0)
Eosinophils Absolute: 0.4 10*3/uL (ref 0.0–0.7)
HEMATOCRIT: 35.9 % — AB (ref 36.0–46.0)
Hemoglobin: 11.2 g/dL — ABNORMAL LOW (ref 12.0–15.0)
LYMPHS ABS: 1.5 10*3/uL (ref 0.7–4.0)
LYMPHS PCT: 24.6 % (ref 12.0–46.0)
MCHC: 31.3 g/dL (ref 30.0–36.0)
MCV: 71 fl — ABNORMAL LOW (ref 78.0–100.0)
MONOS PCT: 7.3 % (ref 3.0–12.0)
Monocytes Absolute: 0.5 10*3/uL (ref 0.1–1.0)
NEUTROS PCT: 61.5 % (ref 43.0–77.0)
Neutro Abs: 3.8 10*3/uL (ref 1.4–7.7)
Platelets: 360 10*3/uL (ref 150.0–400.0)
RBC: 5.06 Mil/uL (ref 3.87–5.11)
RDW: 17 % — ABNORMAL HIGH (ref 11.5–15.5)
WBC: 6.2 10*3/uL (ref 4.0–10.5)

## 2015-01-25 LAB — HEPATIC FUNCTION PANEL
ALBUMIN: 4 g/dL (ref 3.5–5.2)
ALT: 15 U/L (ref 0–35)
AST: 20 U/L (ref 0–37)
Alkaline Phosphatase: 40 U/L (ref 39–117)
Bilirubin, Direct: 0 mg/dL (ref 0.0–0.3)
TOTAL PROTEIN: 7.1 g/dL (ref 6.0–8.3)
Total Bilirubin: 0.3 mg/dL (ref 0.2–1.2)

## 2015-01-25 LAB — BASIC METABOLIC PANEL
BUN: 12 mg/dL (ref 6–23)
CO2: 26 meq/L (ref 19–32)
CREATININE: 0.78 mg/dL (ref 0.40–1.20)
Calcium: 9.1 mg/dL (ref 8.4–10.5)
Chloride: 102 mEq/L (ref 96–112)
GFR: 100.16 mL/min (ref >60.00)
Glucose, Bld: 90 mg/dL (ref 70–99)
Potassium: 4.1 mEq/L (ref 3.5–5.1)
Sodium: 135 mEq/L (ref 135–145)

## 2015-01-25 LAB — LIPID PANEL
Cholesterol: 241 mg/dL — ABNORMAL HIGH (ref 0–200)
HDL: 65 mg/dL (ref >39.00)
LDL CALC: 147 mg/dL — AB (ref 0–99)
NONHDL: 176.17
Total CHOL/HDL Ratio: 4
Triglycerides: 147 mg/dL (ref 0.0–149.0)
VLDL: 29.4 mg/dL (ref 0.0–40.0)

## 2015-01-30 ENCOUNTER — Encounter: Payer: Self-pay | Admitting: Internal Medicine

## 2015-01-30 ENCOUNTER — Ambulatory Visit (INDEPENDENT_AMBULATORY_CARE_PROVIDER_SITE_OTHER): Payer: 59 | Admitting: Internal Medicine

## 2015-01-30 VITALS — BP 126/88 | HR 99 | Temp 98.5°F | Ht 71.0 in | Wt 191.0 lb

## 2015-01-30 DIAGNOSIS — Z23 Encounter for immunization: Secondary | ICD-10-CM | POA: Diagnosis not present

## 2015-01-30 DIAGNOSIS — Z0000 Encounter for general adult medical examination without abnormal findings: Secondary | ICD-10-CM | POA: Diagnosis not present

## 2015-01-30 MED ORDER — DICLOFENAC SODIUM 75 MG PO TBEC: 75.0000 mg | DELAYED_RELEASE_TABLET | Freq: Two times a day (BID) | ORAL | Status: DC

## 2015-01-30 NOTE — Progress Notes (Signed)
Subjective:    Patient ID: Ruth Medina, female    DOB: [birth year], 49 y.o.   MRN: [id number]  HPI  Here for wellness and f/u;  Overall doing ok;  Pt denies Chest pain, worsening SOB, DOE, wheezing, orthopnea, PND, worsening LE edema, palpitations, dizziness or syncope.  Pt denies neurological change such as new headache, facial or extremity weakness.  Pt denies polydipsia, polyuria, or low sugar symptoms. Pt states overall good compliance with treatment and medications, good tolerability, and has been trying to follow appropriate diet.  Pt denies worsening depressive symptoms, suicidal ideation or panic. No fever, night sweats, wt loss, loss of appetite, or other constitutional symptoms.  Pt states good ability with ADL's, has low fall risk, home safety reviewed and adequate, no other significant changes in hearing or vision, anf active with exercis with wt lifting several times per wk.  No current complaints Past Medical History  Diagnosis Date  . 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 FRACTURE, HAND, LEFT 11/29/2007  . FRACTURE, PELVIS 11/29/2007  . HYPERLIPIDEMIA 01/06/2010  . SINUSITIS- ACUTE-NOS 10/12/2008   Past Surgical History  Procedure Laterality Date  . Left hand surgery      Dr. Eulah Pont  . Aspiration of left knee      reports that she has never smoked. She does not have any smokeless tobacco history on file. She reports that she does not drink alcohol or use illicit drugs. family history includes Alcohol abuse in her father; Breast cancer in her mother; Cancer in her mother; Hypertension in her brother, brother, father, and mother. Allergies  Allergen Reactions  . Eggs Or Egg-Derived Products Nausea And Vomiting  . Glucosamine Forte [Nutritional Supplements] Rash  . Influenza Vaccines Rash  . Shellfish Allergy Hives and Rash   Current Outpatient Prescriptions on File Prior to Visit  Medication Sig Dispense Refill  . aspirin 81 MG tablet Take 81 mg by mouth daily.    . Biotin  10 MG CAPS Take 1 capsule by mouth daily.    . ferrous sulfate 325 (65 FE) MG tablet Take 325 mg by mouth daily with breakfast.    . fish oil-omega-3 fatty acids 1000 MG capsule Take 1 g by mouth daily.    . Multiple Vitamin (MULTIVITAMIN) tablet Take 1 tablet by mouth daily.      . vitamin E 400 UNIT capsule Take 400 Units by mouth daily.    . cephALEXin (KEFLEX) 500 MG capsule Take 1 capsule (500 mg total) by mouth 3 (three) times daily. (Patient not taking: Reported on 01/30/2015) 21 capsule 0  . clonazePAM (KLONOPIN) 0.5 MG tablet 1 tab by mouth in the am and 1-2 tabs in the pm as needed for stress and sleep (Patient not taking: Reported on 01/30/2015) 90 tablet 2  . predniSONE (DELTASONE) 20 MG tablet Take 2 tablets (40 mg total) by mouth daily. (Patient not taking: Reported on 01/30/2015) 10 tablet 0   No current facility-administered medications on file prior to visit.   Review of Systems Constitutional: Negative for increased diaphoresis, other activity, appetite or siginficant weight change other than noted HENT: Negative for worsening hearing loss, ear pain, facial swelling, mouth sores and neck stiffness.   Eyes: Negative for other worsening pain, redness or visual disturbance.  Respiratory: Negative for shortness of breath and wheezing  Cardiovascular: Negative for chest pain and palpitations.  Gastrointestinal: Negative for diarrhea, blood in stool, abdominal distention or other pain Genitourinary: Negative for hematuria, flank pain or change in urine  volume.  Musculoskeletal: Negative for myalgias or other joint complaints.  Skin: Negative for color change and wound or drainage.  Neurological: Negative for syncope and numbness. other than noted Hematological: Negative for adenopathy. or other swelling Psychiatric/Behavioral: Negative for hallucinations, SI, self-injury, decreased concentration or other worsening agitation.      Objective:   Physical Exam BP 126/88 mmHg  Pulse  99  Temp(Src) 98.5 F (36.9 C) (Oral)  Ht 5\' 11"  (1.803 m)  Wt 191 lb (86.637 kg)  BMI 26.65 kg/m2  SpO2 98%  LMP 01/14/2015 VS noted,  Constitutional: Pt is oriented to person, place, and time. Appears well-developed and well-nourished, in no significant distress Head: Normocephalic and atraumatic.  Right Ear: External ear normal.  Left Ear: External ear normal.  Nose: Nose normal.  Mouth/Throat: Oropharynx is clear and moist.  Eyes: Conjunctivae and EOM are normal. Pupils are equal, round, and reactive to light.  Neck: Normal range of motion. Neck supple. No JVD present. No tracheal deviation present or significant neck LA or mass Cardiovascular: Normal rate, regular rhythm, normal heart sounds and intact distal pulses.   Pulmonary/Chest: Effort normal and breath sounds without rales or wheezing  Abdominal: Soft. Bowel sounds are normal. NT. No HSM  Musculoskeletal: Normal range of motion. Exhibits no edema.  Lymphadenopathy:  Has no cervical adenopathy.  Neurological: Pt is alert and oriented to person, place, and time. Pt has normal reflexes. No cranial nerve deficit. Motor grossly intact Skin: Skin is warm and dry. No rash noted.  Psychiatric:  Has normal mood and affect. Behavior is normal.     Assessment & Plan:

## 2015-01-30 NOTE — Assessment & Plan Note (Signed)

## 2015-01-30 NOTE — Progress Notes (Signed)
Pre visit review using our clinic review tool, if applicable. No additional management support is needed unless otherwise documented below in the visit note. 

## 2015-01-30 NOTE — Addendum Note (Signed)
Addended by: Anselm Jungling on: 01/30/2015 02:16 PM   Modules accepted: Orders

## 2015-01-30 NOTE — Patient Instructions (Addendum)
You had the Tdap (tetanus shot today)  Your EKG and labs were OK today  Please continue all other medications as before, and refills have been done if requested.  Please have the pharmacy call with any other refills you may need.  Please continue your efforts at being more active, low cholesterol diet, and weight control.  You are otherwise up to date with prevention measures today.  Please keep your appointments with your specialists as you may have planned  You will be contacted regarding the referral for: colonoscopy  Please return in 1 year for your yearly visit, or sooner if needed, with Lab testing done 3-5 days before

## 2015-04-30 ENCOUNTER — Other Ambulatory Visit: Payer: Self-pay | Admitting: Internal Medicine

## 2015-05-21 ENCOUNTER — Encounter: Payer: Self-pay | Admitting: Internal Medicine

## 2015-05-22 ENCOUNTER — Ambulatory Visit (INDEPENDENT_AMBULATORY_CARE_PROVIDER_SITE_OTHER): Payer: 59 | Admitting: Family Medicine

## 2015-05-22 ENCOUNTER — Other Ambulatory Visit (INDEPENDENT_AMBULATORY_CARE_PROVIDER_SITE_OTHER): Payer: 59

## 2015-05-22 ENCOUNTER — Encounter: Payer: Self-pay | Admitting: Family Medicine

## 2015-05-22 VITALS — BP 118/72 | HR 72 | Ht 71.0 in | Wt 189.0 lb

## 2015-05-22 DIAGNOSIS — M25511 Pain in right shoulder: Secondary | ICD-10-CM

## 2015-05-22 DIAGNOSIS — S43439A Superior glenoid labrum lesion of unspecified shoulder, initial encounter: Secondary | ICD-10-CM | POA: Insufficient documentation

## 2015-05-22 DIAGNOSIS — S43431A Superior glenoid labrum lesion of right shoulder, initial encounter: Secondary | ICD-10-CM

## 2015-05-22 NOTE — Assessment & Plan Note (Signed)
Patient given an injection today and had good resolution of pain. I do think that patient has a posterior labral tear and I do not know how large it is on ultrasound. Asian has no restricted range of motion and good strength. Patient does have some mild reactive bursitis. Patient elected to try conservative therapy and given home exercises and work with Event organiserathletic trainer today. We discussed icing regimen, topical anti-inflammatory and prescribed and was given, and over-the-counter medications. Patient will do and icing protocol. We'll see her back again in 4 weeks for further evaluation and treatment.

## 2015-05-22 NOTE — Progress Notes (Signed)
Pre visit review using our clinic review tool, if applicable. No additional management support is needed unless otherwise documented below in the visit note. 

## 2015-05-22 NOTE — Patient Instructions (Signed)
Good to see you.  Ice 20 minutes 2 times daily. Usually after activity and before bed. Exercises 3 times a week.  pennsaid pinkie amount topically 2 times daily as needed.  We injected today  Keep hands within peripheral vision Vitamin D 2000 IU daily Turmeric 500mg  twice daily Happy holidays!  See me again in 4 weeks.

## 2015-05-22 NOTE — Progress Notes (Signed)
[facility] [address] [place], [place] [address] Phone: [phone] Subjective:    I'm seeing this patient by the request  of:  Oliver Barre, MD   CC: Right shoulder pain  BJY:NWGNFAOZHY Ruth Medina is a 49 y.o. female coming in with complaint of right shoulder pain. Patient has had this pain for 2 months. Seem to injury when she was lifting weights above her head. Describes it as a dull, throbbing aching pain. States at night it can become severe where she wakes her up at night. Sometimes radiation to the index finger and thumb. No neck pain associated with it. Patient's states that during regular daily activities it gives her some soreness but nothing that stops her. Rates the severity of pain a 6 out of 10. Denies any type of weakness. No fevers, chills, or any abnormal weight loss. Past Medical History  Diagnosis Date  . 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 FRACTURE, HAND, LEFT 11/29/2007  . FRACTURE, PELVIS 11/29/2007  . HYPERLIPIDEMIA 01/06/2010  . SINUSITIS- ACUTE-NOS 10/12/2008   Past Surgical History  Procedure Laterality Date  . Left hand surgery      Dr. Eulah Pont  . Aspiration of left knee     Social History  Substance Use Topics  . Smoking status: Never Smoker   . Smokeless tobacco: None  . Alcohol Use: No   Allergies  Allergen Reactions  . Eggs Or Egg-Derived Products Nausea And Vomiting  . Glucosamine Forte [Nutritional Supplements] Rash  . Influenza Vaccines Rash  . Shellfish Allergy Hives and Rash   Family History  Problem Relation Age of Onset  . Breast cancer Mother   . Hypertension Mother   . Cancer Mother   . Alcohol abuse Father   . Hypertension Father   . Hypertension Brother   . Hypertension Brother         Past medical history, social, surgical and family history all reviewed in electronic medical record.   Review of Systems: No headache, visual changes, nausea, vomiting, diarrhea, constipation, dizziness,  abdominal pain, skin rash, fevers, chills, night sweats, weight loss, swollen lymph nodes, body aches, joint swelling, muscle aches, chest pain, shortness of breath, mood changes.   Objective Blood pressure 118/72, pulse 72, height  (1.803 m), weight 189 lb (85.73 kg), SpO2 99 %.  General: No apparent distress alert and oriented x3 mood and affect normal, dressed appropriately.  HEENT: Pupils equal, extraocular movements intact  Respiratory: Patient's speak in full sentences and does not appear short of breath  Cardiovascular: No lower extremity edema, non tender, no erythema  Skin: Warm dry intact with no signs of infection or rash on extremities or on axial skeleton.  Abdomen: Soft nontender  Neuro: Cranial nerves II through XII are intact, neurovascularly intact in all extremities with 2+ DTRs and 2+ pulses.  Lymph: No lymphadenopathy of posterior or anterior cervical chain or axillae bilaterally.  Gait normal with good balance and coordination.  MSK:  Non tender with full range of motion and good stability and symmetric strength and tone of  elbows, wrist, hip, knee and ankles bilaterally.  Shoulder: Right Inspection reveals no abnormalities, atrophy or asymmetry. Palpation is normal with no tenderness over AC joint or bicipital groove. ROM is full in all planes passively. Rotator cuff strength normal throughout. signs of impingement with positive Neer and Hawkin's tests, but negative empty can sign. Speeds and Yergason's tests normal. + O'Brien's Normal scapular function observed. No painful arc and  no drop arm sign. No apprehension sign  MSK US performed of: Right This study was ordered, performed, and interpreted by Terrilee FilesZach Umair Rosiles D.O.  Shoulder:   Supraspinatus:  Appears normal on long and transverse views, Bursal bulge seen with shoulder abduction on impingement view. Infraspinatus:  Appears normal on long and transverse views. Significant increase in Doppler  flow Subscapularis:  Appears normal on long and transverse views. Positive bursa Teres Minor:  Appears normal on long and transverse views. AC joint:  Capsule undistended, no geyser sign. Glenohumeral Joint:  Appears normal without effusion. Glenoid Labrum:  Chronic scarring noted with calcific changes. Patient has increasing Doppler flow in this area.. Biceps Tendon:  Positive target sign but no tearing noted  Impression: Subacromial bursitis with labral tear with calcific changes  Procedure: Real-time Ultrasound Guided Injection of right glenohumeral joint Device: GE Logiq E  Ultrasound guided injection is preferred based studies that show increased duration, increased effect, greater accuracy, decreased procedural pain, increased response rate with ultrasound guided versus blind injection.  Verbal informed consent obtained.  Time-out conducted.  Noted no overlying erythema, induration, or other signs of local infection.  Skin prepped in a sterile fashion.  Local anesthesia: Topical Ethyl chloride.  With sterile technique and under real time ultrasound guidance:  Joint visualized.  23g 1  inch needle inserted posterior approach. Pictures taken for needle placement. Patient did have injection of 2 cc of 1% lidocaine, 2 cc of 0.5% Marcaine, and 1.0 cc of Kenalog 40 mg/dL. Completed without difficulty  Pain immediately resolved suggesting accurate placement of the medication.  Advised to call if fevers/chills, erythema, induration, drainage, or persistent bleeding.  Images permanently stored and available for review in the ultrasound unit.  Impression: Technically successful ultrasound guided injection.  Procedure note 97110; 15 minutes spent for Therapeutic exercises as stated in above notes.  This included exercises focusing on stretching, strengthening, with significant focus on eccentric aspects. Basic scapular stabilization to include adduction and depression of scapula Scaption,  focusing on proper movement and good control Internal and External rotation utilizing a theraband, with elbow tucked at side entire time Rows with theraband  Proper technique shown and discussed handout in great detail with ATC.  All questions were discussed and answered.     Impression and Recommendations:     This case required medical decision making of moderate complexity.

## 2015-05-23 ENCOUNTER — Telehealth: Payer: Self-pay | Admitting: Family Medicine

## 2015-05-23 MED ORDER — DICLOFENAC SODIUM 2 % TD SOLN: TRANSDERMAL | Status: DC

## 2015-05-23 NOTE — Telephone Encounter (Signed)
Sent rx into pharmacy. Pt made aware.

## 2015-05-23 NOTE — Telephone Encounter (Signed)
Pt was in yesterday and states Dr. Katrinka BlazingSmith was supposed to send a prescription to [facility] and they say they didn't receive this prescription

## 2015-06-19 ENCOUNTER — Encounter: Payer: Self-pay | Admitting: Family Medicine

## 2015-06-19 ENCOUNTER — Ambulatory Visit (INDEPENDENT_AMBULATORY_CARE_PROVIDER_SITE_OTHER): Payer: 59 | Admitting: Family Medicine

## 2015-06-19 VITALS — BP 132/74 | HR 63 | Ht 71.0 in | Wt 189.0 lb

## 2015-06-19 DIAGNOSIS — S43431D Superior glenoid labrum lesion of right shoulder, subsequent encounter: Secondary | ICD-10-CM | POA: Diagnosis not present

## 2015-06-19 DIAGNOSIS — F419 Anxiety disorder, unspecified: Secondary | ICD-10-CM | POA: Diagnosis not present

## 2015-06-19 MED ORDER — HYDROXYZINE HCL 25 MG PO TABS: 25.0000 mg | ORAL_TABLET | Freq: Three times a day (TID) | ORAL | Status: DC | PRN

## 2015-06-19 MED FILL — hydrOXYzine HCL 25 MG TABS: 25 | 30 days supply | Qty: 90 | Fill #0

## 2015-06-19 NOTE — Assessment & Plan Note (Signed)
Patient does have some anxiety but no signs of any depressive mood. I do believe that patient is having some more situational anxiety. We discussed different medications. Patient elected to try the hydroxyzine. Prescription given today. I think that this could be very beneficial. Patient has any worsening of symptoms she will come back for further follow-up in the next 4 weeks.

## 2015-06-19 NOTE — Progress Notes (Signed)
Pre visit review using our clinic review tool, if applicable. No additional management support is needed unless otherwise documented below in the visit note. 

## 2015-06-19 NOTE — Patient Instructions (Signed)
You are doing great overall Shoulder should be perfect within the next month Try the hydroxyzine when feeling a little wound tight, try at home first See me again when you need me.

## 2015-06-19 NOTE — Progress Notes (Signed)
Ruth Medina, Ruth Medina Phone: [phone] Subjective:    I'm seeing this patient by the request  of:  Oliver Barre, MD   CC: Right shoulder painfollow-up  BJY:NWGNFAOZHY LAKEITHA BASQUES is a 50 y.o. female coming in with complaint of right shoulder pain. Patient has had this pain for 2 months. Seem to injury when she was lifting weights above her head.  Patient was found to have a very small labral tear with calcific changes. Patient elected to have an injection and doing home exercises. Patient is lifting a little bit more. States that the pain is well controlled. Still some very minimal loss of range of motion. Patient states though that she is 90% better.  Patient is having some stress. Describes it as more of a dull, seems to be all related to work. Having difficulty with it. Has had Klonopin in the past which was helpful. Patient is feeling that her anxiety is getting the best of her. Making it difficult to do even daily activities sometimes. Past Medical History  Diagnosis Date  . 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 FRACTURE, HAND, LEFT 11/29/2007  . FRACTURE, PELVIS 11/29/2007  . HYPERLIPIDEMIA 01/06/2010  . SINUSITIS- ACUTE-NOS 10/12/2008   Past Surgical History  Procedure Laterality Date  . Left hand surgery      Dr. Eulah Pont  . Aspiration of left knee     Social History  Substance Use Topics  . Smoking status: Never Smoker   . Smokeless tobacco: None  . Alcohol Use: No   Allergies  Allergen Reactions  . Eggs Or Egg-Derived Products Nausea And Vomiting  . Glucosamine Forte [Nutritional Supplements] Rash  . Influenza Vaccines Rash  . Shellfish Allergy Hives and Rash   Family History  Problem Relation Age of Onset  . Breast cancer Mother   . Hypertension Mother   . Cancer Mother   . Alcohol abuse Father   . Hypertension Father   . Hypertension Brother   . Hypertension Brother         Past medical  history, social, surgical and family history all reviewed in electronic medical record.   Review of Systems: No headache, visual changes, nausea, vomiting, diarrhea, constipation, dizziness, abdominal pain, skin rash, fevers, chills, night sweats, weight loss, swollen lymph nodes, body aches, joint swelling, muscle aches, chest pain, shortness of breath, mood changes.   Objective Blood pressure 132/74, pulse 63, height  (1.803 m), weight 189 lb (85.73 kg), SpO2 98 %.  General: No apparent distress alert and oriented x3 mood and affect normal, dressed appropriately.  HEENT: Pupils equal, extraocular movements intact  Respiratory: Patient's speak in full sentences and does not appear short of breath  Cardiovascular: No lower extremity edema, non tender, no erythema  Skin: Warm dry intact with no signs of infection or rash on extremities or on axial skeleton.  Abdomen: Soft nontender  Neuro: Cranial nerves II through XII are intact, neurovascularly intact in all extremities with 2+ DTRs and 2+ pulses.  Lymph: No lymphadenopathy of posterior or anterior cervical chain or axillae bilaterally.  Gait normal with good balance and coordination.  MSK:  Non tender with full range of motion and good stability and symmetric strength and tone of  elbows, wrist, hip, knee and ankles bilaterally.  Shoulder: Right Inspection reveals no abnormalities, atrophy or asymmetry. Palpation is normal with no tenderness over AC joint or bicipital groove. ROM is full in all planes passively. Rotator  cuff strength normal throughout. Very minimal signs of impingement Speeds and Yergason's tests normal. + O'Brien'ssome present but improved Normal scapular function observed. No painful arc and no drop arm sign. No apprehension sign      Impression and Recommendations:     This case required medical decision making of moderate complexity.

## 2015-06-19 NOTE — Assessment & Plan Note (Signed)
Improving a moment. Patient is 98% better. Discussed with her to continue the home exercises at this time. If worsening symptoms to come back again for further follow-up.

## 2015-07-19 MED FILL — DICLOFENAC SOD EC 75 MG TAB: 75 | 30 days supply | Qty: 60 | Fill #1

## 2015-10-09 MED FILL — DICLOFENAC SOD EC 75 MG TAB: 75 | 30 days supply | Qty: 60 | Fill #2

## 2015-12-16 ENCOUNTER — Encounter: Payer: Self-pay | Admitting: Family Medicine

## 2015-12-16 ENCOUNTER — Ambulatory Visit (INDEPENDENT_AMBULATORY_CARE_PROVIDER_SITE_OTHER): Payer: 59 | Admitting: Family Medicine

## 2015-12-16 VITALS — BP 124/82 | HR 74 | Temp 98.6°F | Ht 71.0 in | Wt 193.5 lb

## 2015-12-16 DIAGNOSIS — B0239 Other herpes zoster eye disease: Principal | ICD-10-CM

## 2015-12-16 DIAGNOSIS — B0230 Zoster ocular disease, unspecified: Secondary | ICD-10-CM | POA: Diagnosis not present

## 2015-12-16 MED ORDER — VALACYCLOVIR HCL 1 G PO TABS: 1000.0000 mg | ORAL_TABLET | Freq: Three times a day (TID) | ORAL | Status: DC

## 2015-12-16 MED FILL — valACYclovir HCL 1 GM TABS: 1 | 7 days supply | Qty: 21 | Fill #0

## 2015-12-16 NOTE — Progress Notes (Signed)
Pre visit review using our clinic review tool, if applicable. No additional management support is needed unless otherwise documented below in the visit note. 

## 2015-12-16 NOTE — Progress Notes (Signed)
Subjective:    Patient ID: Ruth Medina, female    DOB: June 18, 1965, 50 y.o.   MRN: [id number]  HPI  Ruth Medina is a 50 year old female who presents today with vesicular lesion over her right eye that presented 48 hours ago. Additional lesions have presented on the right side of her nose today and these have not crossed the midline. Associated symptoms of eye twitching and pain have been present. Pain is rated as a 7 and described as aching and this has been more noticed at night.  She denies fever, chills, sweats, headache, change or vision, and malaise. Ibuprofen has provided moderate benefit. No aggravating or alleviating factors are noted.  Review of Systems  Constitutional: Negative for fever, chills and fatigue.  HENT: Negative for postnasal drip, rhinorrhea and sneezing.   Respiratory: Negative for cough and shortness of breath.   Cardiovascular: Negative for chest pain, palpitations and leg swelling.  Gastrointestinal: Negative for nausea, vomiting, abdominal pain, diarrhea and constipation.  Skin: Positive for rash.  Neurological: Negative for dizziness, light-headedness, numbness and headaches.   Past Medical History  Diagnosis Date  . 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 FRACTURE, HAND, LEFT 11/29/2007  . FRACTURE, PELVIS 11/29/2007  . HYPERLIPIDEMIA 01/06/2010  . SINUSITIS- ACUTE-NOS 10/12/2008     Social History   Social History  . Marital Status: Single    Spouse Name: N/A  . Number of Children: N/A  . Years of Education: N/A   Occupational History  . Not on file.   Social History Main Topics  . Smoking status: Never Smoker   . Smokeless tobacco: Not on file  . Alcohol Use: No  . Drug Use: No  . Sexual Activity: No   Other Topics Concern  . Not on file   Social History Narrative    Past Surgical History  Procedure Laterality Date  . Left hand surgery      Dr. Eulah PontMurphy  . Aspiration of left knee      Family History  Problem Relation Age of Onset    . Breast cancer Mother   . Hypertension Mother   . Cancer Mother   . Alcohol abuse Father   . Hypertension Father   . Hypertension Brother   . Hypertension Brother     Allergies  Allergen Reactions  . Eggs Or Egg-Derived Products Nausea And Vomiting  . Glucosamine Forte [Nutritional Supplements] Rash  . Influenza Vaccines Rash  . Shellfish Allergy Hives and Rash    Current Outpatient Prescriptions on File Prior to Visit  Medication Sig Dispense Refill  . aspirin 81 MG tablet Take 81 mg by mouth daily.    . Biotin 10 MG CAPS Take 1 capsule by mouth daily.    . cholecalciferol (VITAMIN D) 1000 units tablet Take 2,000 Units by mouth daily.    . diclofenac (VOLTAREN) 75 MG EC tablet TAKE 1 TABLET BY MOUTH 2 TIMES DAILY AS NEEDED FOR PAIN 60 tablet 3  . Diclofenac Sodium 2 % SOLN Apply 1 pump twice daily. 112 g 3  . ferrous sulfate 325 (65 FE) MG tablet Take 325 mg by mouth daily with breakfast.    . fish oil-omega-3 fatty acids 1000 MG capsule Take 1 g by mouth daily.    . Multiple Vitamin (MULTIVITAMIN) tablet Take 1 tablet by mouth daily.      . vitamin E 400 UNIT capsule Take 400 Units by mouth daily.     No current facility-administered medications on file prior  to visit.    BP 124/82 mmHg  Pulse 74  Temp(Src) 98.6 F (37 C) (Oral)  Ht  (1.803 m)  Wt 193 lb 8 oz (87.771 kg)  BMI 27.00 kg/m2  SpO2 99%      Objective:   Physical Exam  Constitutional: She is oriented to person, place, and time. She appears well-developed and well-nourished.  Eyes: Conjunctivae are normal. Pupils are equal, round, and reactive to light. No scleral icterus.  No vision changes through gross examination of small and large print are noted  Neck: Neck supple.  Cardiovascular: Normal rate and regular rhythm.   Pulmonary/Chest: Effort normal and breath sounds normal.  Lymphadenopathy:    She has no cervical adenopathy.  Neurological: She is alert and oriented to person, place, and  time.  Skin: Skin is warm and dry. Rash noted.  Clustered vesicular lesions noted over right eye extending down the right side of her nose. Mild edema to her right eyelid is present.        Assessment & Plan:  1. Herpes zoster of eye Valtrex treatment has been initiated with an urgent referral to ophthalmology placed today. Patient will receive appointment for ophthalmology prior to leaving clinic per Debra the referral coordinator. Verbal and written information regarding herpes zoster has been provided to patient. We discussed the urgency of treatment and need for ophthalmology referral since lesions are around the eye and have now extended to her nose. She verbalizes understanding and agrees with plan. - valACYclovir (VALTREX) 1000 MG tablet; Take 1 tablet (1,000 mg total) by mouth 3 (three) times daily.  Dispense: 21 tablet; Refill: 0 - Ambulatory referral to Ophthalmology  Roddie Mc, FNP-C

## 2015-12-16 NOTE — Patient Instructions (Signed)
Shingles Shingles is an infection that causes a painful skin rash and fluid-filled blisters. Shingles is caused by the same virus that causes chickenpox. Shingles only develops in people who:  Have had chickenpox.  Have gotten the chickenpox vaccine. (This is rare.) The first symptoms of shingles may be itching, tingling, or pain in an area on your skin. A rash will follow in a few days or weeks. The rash is usually on one side of the body in a bandlike or beltlike pattern. Over time, the rash turns into fluid-filled blisters that break open, scab over, and dry up. Medicines may:  Help you manage pain.  Help you recover more quickly.  Help to prevent long-term problems. HOME CARE Medicines  Take medicines only as told by your doctor.  Apply an anti-itch or numbing cream to the affected area as told by your doctor. Blister and Rash Care  Take a cool bath or put cool compresses on the area of the rash or blisters as told by your doctor. This may help with pain and itching.  Keep your rash covered with a loose bandage (dressing). Wear loose-fitting clothing.  Keep your rash and blisters clean with mild soap and cool water or as told by your doctor.  Check your rash every day for signs of infection. These include redness, swelling, and pain that lasts or gets worse.  Do not pick your blisters.  Do not scratch your rash. General Instructions  Rest as told by your doctor.  Keep all follow-up visits as told by your doctor. This is important.  Until your blisters scab over, your infection can cause chickenpox in people who have never had it or been vaccinated against it. To prevent this from happening, avoid touching other people or being around other people, especially:  Babies.  Pregnant women.  Children who have eczema.  Elderly people who have transplants.  People who have chronic illnesses, such as leukemia or AIDS. GET HELP IF:  Your pain does not get better with  medicine.  Your pain does not get better after the rash heals.  Your rash looks infected. Signs of infection include:  Redness.  Swelling.  Pain that lasts or gets worse. GET HELP RIGHT AWAY IF:  The rash is on your face or nose.  You have pain in your face, pain around your eye area, or loss of feeling on one side of your face.  You have ear pain or you have ringing in your ear.  You have loss of taste.  Your condition gets worse.   This information is not intended to replace advice given to you by your health care provider. Make sure you discuss any questions you have with your health care provider.   Document Released: 11/04/2007 Document Revised: 06/08/2014 Document Reviewed: 02/27/2014 Elsevier Interactive Patient Education 2016 [employer]  

## 2015-12-17 DIAGNOSIS — B0230 Zoster ocular disease, unspecified: Secondary | ICD-10-CM | POA: Diagnosis not present

## 2015-12-24 DIAGNOSIS — H5213 Myopia, bilateral: Secondary | ICD-10-CM | POA: Diagnosis not present

## 2016-01-10 MED FILL — DICLOFENAC SOD EC 75 MG TAB: 75 | 30 days supply | Qty: 60 | Fill #3

## 2016-01-13 DIAGNOSIS — N9089 Other specified noninflammatory disorders of vulva and perineum: Secondary | ICD-10-CM | POA: Diagnosis not present

## 2016-01-13 DIAGNOSIS — Z01419 Encounter for gynecological examination (general) (routine) without abnormal findings: Secondary | ICD-10-CM | POA: Diagnosis not present

## 2016-01-13 DIAGNOSIS — Z1231 Encounter for screening mammogram for malignant neoplasm of breast: Secondary | ICD-10-CM | POA: Diagnosis not present

## 2016-01-13 DIAGNOSIS — Z6827 Body mass index (BMI) 27.0-27.9, adult: Secondary | ICD-10-CM | POA: Diagnosis not present

## 2016-01-14 MED FILL — NYSTATIN-TRIAMCINOLONE OINT: 100000-0.1 | 10 days supply | Qty: 30 | Fill #0

## 2016-01-16 MED FILL — PROCTO-MED HC 2.5% CREAM: 2.5 | 7 days supply | Qty: 30 | Fill #0

## 2016-04-16 ENCOUNTER — Telehealth: Payer: Self-pay | Admitting: Internal Medicine

## 2016-04-16 DIAGNOSIS — Z0001 Encounter for general adult medical examination with abnormal findings: Secondary | ICD-10-CM

## 2016-04-16 NOTE — Telephone Encounter (Signed)
Patient is request lab orders to be entered before CPE at end of month.

## 2016-04-17 NOTE — Telephone Encounter (Signed)
Ok, but remember traditional medicare does not pay for labs prior, but should be ok with Commercial (private) insurance  (I cannot tell insurance as it is not listed in the patient banner)

## 2016-04-27 ENCOUNTER — Other Ambulatory Visit (INDEPENDENT_AMBULATORY_CARE_PROVIDER_SITE_OTHER): Payer: 59

## 2016-04-27 DIAGNOSIS — Z0001 Encounter for general adult medical examination with abnormal findings: Secondary | ICD-10-CM | POA: Diagnosis not present

## 2016-04-27 LAB — LIPID PANEL
Cholesterol: 317 mg/dL — ABNORMAL HIGH (ref 0–200)
HDL: 64.7 mg/dL (ref >39.00)
LDL CALC: 222 mg/dL — AB (ref 0–99)
NONHDL: 252.64
TRIGLYCERIDES: 152 mg/dL — AB (ref 0.0–149.0)
Total CHOL/HDL Ratio: 5
VLDL: 30.4 mg/dL (ref 0.0–40.0)

## 2016-04-27 LAB — CBC WITH DIFFERENTIAL/PLATELET
BASOS PCT: 0.6 % (ref 0.0–3.0)
Basophils Absolute: 0 10*3/uL (ref 0.0–0.1)
EOS PCT: 8.6 % — AB (ref 0.0–5.0)
Eosinophils Absolute: 0.5 10*3/uL (ref 0.0–0.7)
HEMATOCRIT: 37.2 % (ref 36.0–46.0)
HEMOGLOBIN: 12 g/dL (ref 12.0–15.0)
LYMPHS PCT: 31.5 % (ref 12.0–46.0)
Lymphs Abs: 2 10*3/uL (ref 0.7–4.0)
MCHC: 32.1 g/dL (ref 30.0–36.0)
MCV: 70.5 fl — ABNORMAL LOW (ref 78.0–100.0)
Monocytes Absolute: 0.5 10*3/uL (ref 0.1–1.0)
Monocytes Relative: 7.8 % (ref 3.0–12.0)
NEUTROS ABS: 3.2 10*3/uL (ref 1.4–7.7)
Neutrophils Relative %: 51.5 % (ref 43.0–77.0)
PLATELETS: 332 10*3/uL (ref 150.0–400.0)
RBC: 5.28 Mil/uL — ABNORMAL HIGH (ref 3.87–5.11)
RDW: 15.3 % (ref 11.5–15.5)
WBC: 6.3 10*3/uL (ref 4.0–10.5)

## 2016-04-27 LAB — BASIC METABOLIC PANEL
BUN: 16 mg/dL (ref 6–23)
CO2: 27 mEq/L (ref 19–32)
CREATININE: 0.93 mg/dL (ref 0.40–1.20)
Calcium: 9.6 mg/dL (ref 8.4–10.5)
Chloride: 103 mEq/L (ref 96–112)
GFR: 81.36 mL/min (ref >60.00)
Glucose, Bld: 94 mg/dL (ref 70–99)
POTASSIUM: 3.8 meq/L (ref 3.5–5.1)
Sodium: 138 mEq/L (ref 135–145)

## 2016-04-27 LAB — HEPATIC FUNCTION PANEL
ALT: 21 U/L (ref 0–35)
AST: 19 U/L (ref 0–37)
Albumin: 4.4 g/dL (ref 3.5–5.2)
Alkaline Phosphatase: 48 U/L (ref 39–117)
BILIRUBIN DIRECT: 0 mg/dL (ref 0.0–0.3)
Total Bilirubin: 0.3 mg/dL (ref 0.2–1.2)
Total Protein: 7.7 g/dL (ref 6.0–8.3)

## 2016-04-27 LAB — TSH: TSH: 1.04 u[IU]/mL (ref 0.35–4.50)

## 2016-04-28 LAB — URINALYSIS, ROUTINE W REFLEX MICROSCOPIC
Bilirubin Urine: NEGATIVE
Hgb urine dipstick: NEGATIVE
Ketones, ur: NEGATIVE
Leukocytes, UA: NEGATIVE
Nitrite: NEGATIVE
RBC / HPF: NONE SEEN (ref 0–?)
SPECIFIC GRAVITY, URINE: 1.02 (ref 1.000–1.030)
TOTAL PROTEIN, URINE-UPE24: NEGATIVE
URINE GLUCOSE: NEGATIVE
Urobilinogen, UA: 0.2 (ref 0.0–1.0)
pH: 5.5 (ref 5.0–8.0)

## 2016-04-30 ENCOUNTER — Ambulatory Visit (INDEPENDENT_AMBULATORY_CARE_PROVIDER_SITE_OTHER): Payer: 59 | Admitting: Internal Medicine

## 2016-04-30 ENCOUNTER — Encounter: Payer: Self-pay | Admitting: Internal Medicine

## 2016-04-30 VITALS — BP 120/76 | HR 90 | Resp 20 | Wt 192.0 lb

## 2016-04-30 DIAGNOSIS — E785 Hyperlipidemia, unspecified: Secondary | ICD-10-CM

## 2016-04-30 DIAGNOSIS — M25562 Pain in left knee: Secondary | ICD-10-CM | POA: Diagnosis not present

## 2016-04-30 DIAGNOSIS — Z0000 Encounter for general adult medical examination without abnormal findings: Secondary | ICD-10-CM

## 2016-04-30 DIAGNOSIS — Z1159 Encounter for screening for other viral diseases: Secondary | ICD-10-CM | POA: Diagnosis not present

## 2016-04-30 DIAGNOSIS — Z1211 Encounter for screening for malignant neoplasm of colon: Secondary | ICD-10-CM

## 2016-04-30 MED ORDER — DICLOFENAC SODIUM 75 MG PO TBEC: DELAYED_RELEASE_TABLET | ORAL | 1 refills | Status: DC

## 2016-04-30 MED FILL — DICLOFENAC SOD 75 MG TAB EC: 75 | 90 days supply | Qty: 180 | Fill #0

## 2016-04-30 NOTE — Patient Instructions (Addendum)
Please continue all other medications as before, and refills have been done if requested - the voltaren  Please have the pharmacy call with any other refills you may need.  Please continue your efforts at being more active, low cholesterol diet, and weight control.  You are otherwise up to date with prevention measures today.  Please keep your appointments with your specialists as you may have planned  You will be contacted regarding the referral for: colonoscopy  Please return in 1 year for your yearly visit, or sooner if needed, with Lab testing done 3-5 days before

## 2016-04-30 NOTE — Progress Notes (Signed)
Subjective:    Patient ID: Ruth Medina, female    DOB: 1965-09-13, 50 y.o.   MRN: [id number]  HPI  Here for wellness and f/u;  Overall doing ok;  Pt denies Chest pain, worsening SOB, DOE, wheezing, orthopnea, PND, worsening LE edema, palpitations, dizziness or syncope.  Pt denies neurological change such as new headache, facial or extremity weakness.  Pt denies polydipsia, polyuria, or low sugar symptoms. Pt states overall good compliance with treatment and medications, good tolerability, and has been trying to follow appropriate diet.  Pt denies worsening depressive symptoms, suicidal ideation or panic. No fever, night sweats, wt loss, loss of appetite, or other constitutional symptoms.  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  Trying to follow lower chol diet.  Co mild to mod left knee pain with crepitus, no falls or giveaways, out of voltaren, no swelling, aggrevated about 3 wks ago, s/p 3 coritsone shot in past  No other change to history Past Medical History:  Diagnosis Date  . 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 FRACTURE, HAND, LEFT 11/29/2007  . FRACTURE, PELVIS 11/29/2007  . HYPERLIPIDEMIA 01/06/2010  . SINUSITIS- ACUTE-NOS 10/12/2008   Past Surgical History:  Procedure Laterality Date  . aspiration of left knee    . Left hand surgery     Dr. Eulah PontMurphy    reports that she has never smoked. She does not have any smokeless tobacco history on file. She reports that she does not drink alcohol or use drugs. family history includes Alcohol abuse in her father; Breast cancer in her mother; Cancer in her mother; Hypertension in her brother, brother, father, and mother. Allergies  Allergen Reactions  . Eggs Or Egg-Derived Products Nausea And Vomiting  . Glucosamine Forte [Nutritional Supplements]  Rash  . Influenza Vaccines Rash  . Shellfish Allergy Hives and Rash   Current Outpatient Prescriptions on File Prior to Visit  Medication Sig Dispense Refill  . aspirin 81 MG tablet Take 81 mg by mouth daily.    . Diclofenac Sodium 2 % SOLN Apply 1 pump twice daily. 112 g 3  . ferrous sulfate 325 (65 FE) MG tablet Take 325 mg by mouth daily with breakfast.    . fish oil-omega-3 fatty acids 1000 MG capsule Take 1 g by mouth daily.    . Multiple Vitamin (MULTIVITAMIN) tablet Take 1 tablet by mouth daily.      . vitamin E 400 UNIT capsule Take 400 Units by mouth daily.     No current facility-administered medications on file prior to visit.     Review of Systems Constitutional: Negative for increased diaphoresis, or other activity, appetite or siginficant weight change other than noted HENT: Negative for worsening hearing loss, ear pain, facial swelling, mouth sores and neck stiffness.   Eyes: Negative for other worsening pain, redness or visual disturbance.  Respiratory: Negative for choking or stridor Cardiovascular: Negative for other chest pain and palpitations.  Gastrointestinal: Negative for worsening diarrhea, blood in stool, or abdominal distention Genitourinary: Negative for hematuria, flank pain or change in urine volume.  Musculoskeletal: Negative for myalgias or other joint complaints.  Skin: Negative for other color change and wound or drainage.  Neurological: Negative for syncope and numbness. other than noted Hematological: Negative for adenopathy. or other swelling Psychiatric/Behavioral: Negative for  hallucinations, SI, self-injury, decreased concentration or other worsening agitation.  All other system neg per pt    Objective:   Physical Exam BP 120/76   Pulse 90   Resp 20   Wt 192 lb (87.1 kg)   SpO2 98%   BMI 26.78 kg/m  VS noted,  Constitutional: Pt is oriented to person, place, and time. Appears well-developed and well-nourished, in no significant  distress Head: Normocephalic and atraumatic  Eyes: Conjunctivae and EOM are normal. Pupils are equal, round, and reactive to light Right Ear: External ear normal.  Left Ear: External ear normal Nose: Nose normal.  Mouth/Throat: Oropharynx is clear and moist  Neck: Normal range of motion. Neck supple. No JVD present. No tracheal deviation present or significant neck LA or mass Cardiovascular: Normal rate, regular rhythm, normal heart sounds and intact distal pulses.   Pulmonary/Chest: Effort normal and breath sounds without rales or wheezing  Abdominal: Soft. Bowel sounds are normal. NT. No HSM  Musculoskeletal: Normal range of motion. Exhibits no edema Lymphadenopathy: Has no cervical adenopathy.  Neurological: Pt is alert and oriented to person, place, and time. Pt has normal reflexes. No cranial nerve deficit. Motor grossly intact Skin: Skin is warm and dry. No rash noted or new ulcers Psychiatric:  Has normal mood and affect. Behavior is normal.  No other exam changes    Assessment & Plan:

## 2016-04-30 NOTE — Progress Notes (Signed)
Pre visit review using our clinic review tool, if applicable. No additional management support is needed unless otherwise documented below in the visit note. 

## 2016-05-02 NOTE — Assessment & Plan Note (Signed)
For nsaid refill, consider sport med f/u,  to f/u any worsening symptoms or concerns

## 2016-05-02 NOTE — Assessment & Plan Note (Signed)

## 2016-05-02 NOTE — Assessment & Plan Note (Signed)
Uncontrolled, goal < 100, declines statin, prefers to try diet and red yeast rice first,  to f/u any worsening symptoms or concerns

## 2016-06-08 ENCOUNTER — Encounter: Payer: Self-pay | Admitting: Internal Medicine

## 2016-08-11 MED FILL — NYSTATIN-TRIAMCINOLONE OINT: 100000-0.1 | 10 days supply | Qty: 30 | Fill #1

## 2016-08-11 MED FILL — PROCTO-MED HC 2.5% CREAM: 2.5 | 7 days supply | Qty: 30 | Fill #1

## 2016-09-17 MED FILL — DICLOFENAC SOD 75 MG TAB EC: 75 | 90 days supply | Qty: 180 | Fill #1

## 2016-10-08 DIAGNOSIS — L42 Pityriasis rosea: Secondary | ICD-10-CM | POA: Diagnosis not present

## 2016-10-08 DIAGNOSIS — N898 Other specified noninflammatory disorders of vagina: Secondary | ICD-10-CM | POA: Diagnosis not present

## 2017-04-28 DIAGNOSIS — Z1231 Encounter for screening mammogram for malignant neoplasm of breast: Secondary | ICD-10-CM | POA: Diagnosis not present

## 2017-04-28 DIAGNOSIS — Z6828 Body mass index (BMI) 28.0-28.9, adult: Secondary | ICD-10-CM | POA: Diagnosis not present

## 2017-04-28 DIAGNOSIS — Z01419 Encounter for gynecological examination (general) (routine) without abnormal findings: Secondary | ICD-10-CM | POA: Diagnosis not present

## 2017-05-05 ENCOUNTER — Telehealth: Payer: Self-pay | Admitting: Internal Medicine

## 2017-05-05 DIAGNOSIS — Z0000 Encounter for general adult medical examination without abnormal findings: Secondary | ICD-10-CM

## 2017-05-05 NOTE — Telephone Encounter (Signed)
Labs have been entered. Inform pt she may come to the lab at her earliest convenience.

## 2017-05-05 NOTE — Telephone Encounter (Signed)
Copied from CRM [id number]. Topic: Quick Communication - See Telephone Encounter >> May 05, 2017  1:02 PM Rudi CocoLathan, Christopher Hink M, NT wrote: CRM for notification. See Telephone encounter for:   05/05/17. Pt. Calling and asking about labs and wanting to have them done before appt. Didn't see any orders to do so. Pt. Would like get a call back to schedule labs for cpe. Pt. Can be reached at [phone]. Pt. York SpanielSaid it was ok to leave vm

## 2017-05-07 ENCOUNTER — Other Ambulatory Visit (INDEPENDENT_AMBULATORY_CARE_PROVIDER_SITE_OTHER): Payer: 59

## 2017-05-07 DIAGNOSIS — Z0000 Encounter for general adult medical examination without abnormal findings: Secondary | ICD-10-CM

## 2017-05-07 DIAGNOSIS — Z1159 Encounter for screening for other viral diseases: Secondary | ICD-10-CM | POA: Diagnosis not present

## 2017-05-07 LAB — LIPID PANEL
CHOL/HDL RATIO: 3
Cholesterol: 232 mg/dL — ABNORMAL HIGH (ref 0–200)
HDL: 68.1 mg/dL (ref >39.00)
LDL CALC: 139 mg/dL — AB (ref 0–99)
NONHDL: 163.63
Triglycerides: 122 mg/dL (ref 0.0–149.0)
VLDL: 24.4 mg/dL (ref 0.0–40.0)

## 2017-05-07 LAB — BASIC METABOLIC PANEL
BUN: 9 mg/dL (ref 6–23)
CHLORIDE: 101 meq/L (ref 96–112)
CO2: 28 meq/L (ref 19–32)
Calcium: 9.2 mg/dL (ref 8.4–10.5)
Creatinine, Ser: 0.96 mg/dL (ref 0.40–1.20)
GFR: 78.12 mL/min (ref >60.00)
Glucose, Bld: 72 mg/dL (ref 70–99)
POTASSIUM: 4 meq/L (ref 3.5–5.1)
Sodium: 137 mEq/L (ref 135–145)

## 2017-05-07 LAB — CBC WITH DIFFERENTIAL/PLATELET
BASOS PCT: 1.1 % (ref 0.0–3.0)
Basophils Absolute: 0.1 10*3/uL (ref 0.0–0.1)
EOS PCT: 7 % — AB (ref 0.0–5.0)
Eosinophils Absolute: 0.4 10*3/uL (ref 0.0–0.7)
HEMATOCRIT: 36.6 % (ref 36.0–46.0)
HEMOGLOBIN: 11.3 g/dL — AB (ref 12.0–15.0)
LYMPHS PCT: 35.3 % (ref 12.0–46.0)
Lymphs Abs: 1.8 10*3/uL (ref 0.7–4.0)
MCHC: 30.8 g/dL (ref 30.0–36.0)
MCV: 70.3 fl — ABNORMAL LOW (ref 78.0–100.0)
Monocytes Absolute: 0.4 10*3/uL (ref 0.1–1.0)
Monocytes Relative: 7.1 % (ref 3.0–12.0)
NEUTROS ABS: 2.5 10*3/uL (ref 1.4–7.7)
Neutrophils Relative %: 49.5 % (ref 43.0–77.0)
Platelets: 352 10*3/uL (ref 150.0–400.0)
RBC: 5.2 Mil/uL — ABNORMAL HIGH (ref 3.87–5.11)
RDW: 15.6 % — AB (ref 11.5–15.5)
WBC: 5.1 10*3/uL (ref 4.0–10.5)

## 2017-05-07 LAB — URINALYSIS, ROUTINE W REFLEX MICROSCOPIC
Bilirubin Urine: NEGATIVE
Hgb urine dipstick: NEGATIVE
KETONES UR: NEGATIVE
LEUKOCYTES UA: NEGATIVE
NITRITE: NEGATIVE
PH: 6 (ref 5.0–8.0)
TOTAL PROTEIN, URINE-UPE24: NEGATIVE
UROBILINOGEN UA: 0.2 (ref 0.0–1.0)
Urine Glucose: NEGATIVE

## 2017-05-07 LAB — HEPATIC FUNCTION PANEL
ALBUMIN: 4.5 g/dL (ref 3.5–5.2)
ALT: 21 U/L (ref 0–35)
AST: 24 U/L (ref 0–37)
Alkaline Phosphatase: 56 U/L (ref 39–117)
BILIRUBIN DIRECT: 0.1 mg/dL (ref 0.0–0.3)
TOTAL PROTEIN: 7.4 g/dL (ref 6.0–8.3)
Total Bilirubin: 0.3 mg/dL (ref 0.2–1.2)

## 2017-05-07 LAB — TSH: TSH: 1.01 u[IU]/mL (ref 0.35–4.50)

## 2017-05-08 LAB — HEPATITIS C ANTIBODY
HEP C AB: NONREACTIVE
SIGNAL TO CUT-OFF: 0.01 (ref <1.00)

## 2017-05-10 ENCOUNTER — Telehealth: Payer: Self-pay | Admitting: Internal Medicine

## 2017-05-10 NOTE — Telephone Encounter (Signed)
Copied from CRM [id number]. Topic: Quick Communication - See Telephone Encounter >> May 10, 2017  1:39 PM Terisa Starraylor, Brittany L wrote: CRM for notification. See Telephone encounter for:  Pt states she needs a refill on her DICLOFENAC, states he gave her a 3 month supply & she is almost out. [facility]. Is  05/10/17.

## 2017-05-11 ENCOUNTER — Encounter: Payer: 59 | Admitting: Internal Medicine

## 2017-05-11 MED ORDER — DICLOFENAC SODIUM 75 MG PO TBEC: DELAYED_RELEASE_TABLET | ORAL | 0 refills | Status: DC

## 2017-05-11 NOTE — Telephone Encounter (Signed)
Please advise regarding this refill. LOV was 04/30/16

## 2017-05-11 NOTE — Telephone Encounter (Signed)
CPE scheduled with you for 06/10/17.

## 2017-05-11 NOTE — Telephone Encounter (Signed)
Done erx 

## 2017-05-12 MED FILL — DICLOFENAC SODIUM 75 MG TAB: 75 | 90 days supply | Qty: 180 | Fill #0

## 2017-05-12 NOTE — Telephone Encounter (Signed)
Pt notified of refill

## 2017-05-17 ENCOUNTER — Encounter: Payer: Self-pay | Admitting: Internal Medicine

## 2017-05-18 NOTE — Telephone Encounter (Signed)
[name] - pt reqeusts Cologuard please, thanks

## 2017-06-10 ENCOUNTER — Ambulatory Visit (INDEPENDENT_AMBULATORY_CARE_PROVIDER_SITE_OTHER): Payer: 59 | Admitting: Internal Medicine

## 2017-06-10 ENCOUNTER — Encounter: Payer: Self-pay | Admitting: Internal Medicine

## 2017-06-10 VITALS — BP 154/102 | HR 95 | Temp 98.2°F | Ht 71.0 in | Wt 198.0 lb

## 2017-06-10 DIAGNOSIS — E785 Hyperlipidemia, unspecified: Secondary | ICD-10-CM

## 2017-06-10 DIAGNOSIS — Z0000 Encounter for general adult medical examination without abnormal findings: Secondary | ICD-10-CM | POA: Diagnosis not present

## 2017-06-10 NOTE — Progress Notes (Signed)
Subjective:    Patient ID: Ruth Medina, female    DOB: 27-Oct-1965, 52 y.o.   MRN: [id number]  HPI  Here for wellness and f/u;  Overall doing ok;  Pt denies Chest pain, worsening SOB, DOE, wheezing, orthopnea, PND, worsening LE edema, palpitations, dizziness or syncope.  Pt denies neurological change such as new headache, facial or extremity weakness.  Pt denies polydipsia, polyuria, or low sugar symptoms. Pt states overall good compliance with treatment and medications, good tolerability, and has been trying to follow appropriate diet.  Pt denies worsening depressive symptoms, suicidal ideation or panic. No fever, night sweats, wt loss, loss of appetite, or other constitutional symptoms.  Pt states good ability with ADL's, has low fall risk, home safety reviewed and adequate, no other significant changes in hearing or vision, and only occasionally active with exercise.  Has really been working on lower chol diet.  Bp at home and work < 140/90, also BP < 110 sbp at Dr Pennie Rushing. No other complaints or interval hx Past Medical History:  Diagnosis Date  . 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 FRACTURE, HAND, LEFT 11/29/2007  . FRACTURE, PELVIS 11/29/2007  . HYPERLIPIDEMIA 01/06/2010  . SINUSITIS- ACUTE-NOS 10/12/2008   Past Surgical History:  Procedure Laterality Date  . aspiration of left knee    . Left hand surgery     Dr. Eulah Pont    reports that  has never smoked. she has never used smokeless tobacco. She reports that she does not drink alcohol or use drugs. family history includes Alcohol abuse in her father; Breast cancer in her mother; Cancer in her mother; Hypertension in her brother, brother, father, and mother. Allergies  Allergen Reactions  . Eggs Or Egg-Derived Products Nausea And Vomiting  . Glucosamine Forte [Nutritional Supplements] Rash  . Influenza Vaccines Rash  . Shellfish Allergy Hives and Rash   Current Outpatient Medications on File Prior to Visit  Medication Sig Dispense  Refill  . aspirin 81 MG tablet Take 81 mg by mouth daily.    . diclofenac (VOLTAREN) 75 MG EC tablet TAKE 1 TABLET BY MOUTH 2 TIMES DAILY AS NEEDED FOR PAIN 180 tablet 0  . Diclofenac Sodium 2 % SOLN Apply 1 pump twice daily. 112 g 3  . ferrous sulfate 325 (65 FE) MG tablet Take 325 mg by mouth daily with breakfast.    . fish oil-omega-3 fatty acids 1000 MG capsule Take 1 g by mouth daily.    . Multiple Vitamin (MULTIVITAMIN) tablet Take 1 tablet by mouth daily.      . vitamin E 400 UNIT capsule Take 400 Units by mouth daily.     No current facility-administered medications on file prior to visit.    Review of Systems Constitutional: Negative for other unusual diaphoresis, sweats, appetite or weight changes HENT: Negative for other worsening hearing loss, ear pain, facial swelling, mouth sores or neck stiffness.   Eyes: Negative for other worsening pain, redness or other visual disturbance.  Respiratory: Negative for other stridor or swelling Cardiovascular: Negative for other palpitations or other chest pain  Gastrointestinal: Negative for worsening diarrhea or loose stools, blood in stool, distention or other pain Genitourinary: Negative for hematuria, flank pain or other change in urine volume.  Musculoskeletal: Negative for myalgias or other joint swelling.  Skin: Negative for other color change, or other wound or worsening drainage.  Neurological: Negative for other syncope or numbness. Hematological: Negative for other adenopathy or swelling Psychiatric/Behavioral: Negative for hallucinations, other worsening agitation,  SI, self-injury, or new decreased concentration All other system neg per pt    Objective:   Physical Exam BP (!) 154/102   Pulse 95   Temp 98.2 F (36.8 C) (Oral)   Ht 5\' 11"  (1.803 m)   Wt 198 lb (89.8 kg)   SpO2 100%   BMI 27.62 kg/m  VS noted,  Constitutional: Pt is oriented to person, place, and time. Appears well-developed and well-nourished, in no  significant distress and comfortable Head: Normocephalic and atraumatic  Eyes: Conjunctivae and EOM are normal. Pupils are equal, round, and reactive to light Right Ear: External ear normal without discharge Left Ear: External ear normal without discharge Nose: Nose without discharge or deformity Mouth/Throat: Oropharynx is without other ulcerations and moist  Neck: Normal range of motion. Neck supple. No JVD present. No tracheal deviation present or significant neck LA or mass Cardiovascular: Normal rate, regular rhythm, normal heart sounds and intact distal pulses.   Pulmonary/Chest: WOB normal and breath sounds without rales or wheezing  Abdominal: Soft. Bowel sounds are normal. NT. No HSM  Musculoskeletal: Normal range of motion. Exhibits no edema Lymphadenopathy: Has no other cervical adenopathy.  Neurological: Pt is alert and oriented to person, place, and time. Pt has normal reflexes. No cranial nerve deficit. Motor grossly intact, Gait intact Skin: Skin is warm and dry. No rash noted or new ulcerations Psychiatric:  Has normal mood and affect. Behavior is normal without agitation No other exam findings Lab Results  Component Value Date   WBC 5.1 05/07/2017   HGB 11.3 (L) 05/07/2017   HCT 36.6 05/07/2017   PLT 352.0 05/07/2017   GLUCOSE 72 05/07/2017   CHOL 232 (H) 05/07/2017   TRIG 122.0 05/07/2017   HDL 68.10 05/07/2017   LDLDIRECT 181.9 01/06/2010   LDLCALC 139 (H) 05/07/2017   ALT 21 05/07/2017   AST 24 05/07/2017   NA 137 05/07/2017   K 4.0 05/07/2017   CL 101 05/07/2017   CREATININE 0.96 05/07/2017   BUN 9 05/07/2017   CO2 28 05/07/2017   TSH 1.01 05/07/2017       Assessment & Plan:

## 2017-06-10 NOTE — Patient Instructions (Signed)
[name] will assist on signing you up for the Cologuard  Please continue all other medications as before, and refills have been done if requested.  Please have the pharmacy call with any other refills you may need.  Please continue your efforts at being more active, low cholesterol diet, and weight control.  You are otherwise up to date with prevention measures today.  Please keep your appointments with your specialists as you may have planned  Please return in 1 year for your yearly visit, or sooner if needed, with Lab testing done 3-5 days before

## 2017-06-12 ENCOUNTER — Encounter: Payer: Self-pay | Admitting: Internal Medicine

## 2017-06-12 NOTE — Assessment & Plan Note (Signed)
Much improved, goal ldl < 100, declines statin, for lower chol diet

## 2017-06-12 NOTE — Assessment & Plan Note (Signed)

## 2017-06-28 DIAGNOSIS — Z1211 Encounter for screening for malignant neoplasm of colon: Secondary | ICD-10-CM | POA: Diagnosis not present

## 2017-06-28 DIAGNOSIS — Z1212 Encounter for screening for malignant neoplasm of rectum: Secondary | ICD-10-CM | POA: Diagnosis not present

## 2017-06-28 LAB — COLOGUARD: Cologuard: NEGATIVE

## 2017-07-27 ENCOUNTER — Encounter: Payer: Self-pay | Admitting: Internal Medicine

## 2017-08-31 ENCOUNTER — Telehealth: Payer: Self-pay | Admitting: Family Medicine

## 2017-08-31 NOTE — Telephone Encounter (Signed)
Called patient and offered her an appointment with Dr. Jordan LikesSchmitz today but she declined and would like to wait to see Dr. Katrinka BlazingSmith on the 17th.

## 2017-08-31 NOTE — Telephone Encounter (Signed)
Copied from CRM [id number]. Topic: Appointment Scheduling - Scheduling Inquiry for Clinic >> Aug 31, 2017 10:49 AM Ruth Medina, Ruth Medina, NT wrote: Reason for CRM: Patient has  appointment for 09/15/17 with Dr Katrinka BlazingSmith, and needs to seen before this date if possible ,she is in a lot of pain and her knee is not  getting any better ,please call her at [phone] if she can seen earlier , thanks

## 2017-09-15 ENCOUNTER — Ambulatory Visit: Payer: 59 | Admitting: Family Medicine

## 2017-10-22 ENCOUNTER — Other Ambulatory Visit: Payer: Self-pay | Admitting: Internal Medicine

## 2017-10-22 MED FILL — DICLOFENAC SOD EC 75 MG TAB: 75 | 90 days supply | Qty: 180 | Fill #0

## 2017-11-30 ENCOUNTER — Telehealth: Payer: Self-pay

## 2017-11-30 NOTE — Telephone Encounter (Signed)
Spoke with patient and scheduled her for later this month with Dr. Katrinka BlazingSmith at his first opening.

## 2017-11-30 NOTE — Telephone Encounter (Signed)
Copied from CRM [id number]. Topic: Quick Communication - See Telephone Encounter >> Nov 29, 2017  9:57 AM Herby AbrahamJohnson, Shiquita C wrote: CRM for notification. See Telephone encounter for: 11/29/17.  Pt called in to schedule an apt with Dr. Katrinka BlazingSmith, pt says that she is having knee pain. Not showing providers next available until 12/23/17, pt would like to know if provider is able to work her in sooner?  CB: [phone] (work)   >> Nov 30, 2017  3:06 PM Morphies, Hermine MessickCarson J wrote: Patient called back following up on this request.

## 2017-12-08 ENCOUNTER — Encounter: Payer: Self-pay | Admitting: Internal Medicine

## 2017-12-08 MED ORDER — CLOBETASOL PROP EMOLLIENT BASE 0.05 % EX CREA: 1.0000 "application " | TOPICAL_CREAM | Freq: Two times a day (BID) | CUTANEOUS | 0 refills | Status: DC | PRN

## 2017-12-08 MED FILL — CLOBETASOL EMOLLIENT 0.05%: 0.05 | 10 days supply | Qty: 30 | Fill #0

## 2017-12-17 NOTE — Progress Notes (Signed)
Tawana ScaleZach Merion Grimaldo D.O. [facility] [address] [place], [place] [address] Phone: [phone] Subjective:     CC: Left knee pain  UVO:[id number]:Subjective  Ruth Medina is a 52 y.o. female coming in with complaint of left knee pain. Patient was doing leg press at the gym. Pain on the anterior portion. Has been using Aleve which was helping but is no longer working. Pain and swelling seem to have increased over the past 2 weeks. Has injured this knee previously.  Patient has not been seen for quite some time.  States that now having even difficulty with daily activities such as walking.  Can wake her up at night.  Rates the severity pain is 7 out of 10.  Had a ganglion cyst noted greater than 3 years ago on the anterior aspect of the knee.  States that this feels different      Past Medical History:  Diagnosis Date  . 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 FRACTURE, HAND, LEFT 11/29/2007  . FRACTURE, PELVIS 11/29/2007  . HYPERLIPIDEMIA 01/06/2010  . SINUSITIS- ACUTE-NOS 10/12/2008   Past Surgical History:  Procedure Laterality Date  . aspiration of left knee    . Left hand surgery     Dr. Eulah PontMurphy   Social History   Socioeconomic History  . Marital status: Single    Spouse name: Not on file  . Number of children: Not on file  . Years of education: Not on file  . Highest education level: Not on file  Occupational History  . Not on file  Social Needs  . Financial resource strain: Not on file  . Food insecurity:    Worry: Not on file    Inability: Not on file  . Transportation needs:    Medical: Not on file    Non-medical: Not on file  Tobacco Use  . Smoking status: Never Smoker  . Smokeless tobacco: Never Used  Substance and Sexual Activity  . Alcohol use: No  . Drug use: No  . Sexual activity: Never    Birth control/protection: Diaphragm  Lifestyle  . Physical activity:    Days per week: Not on file    Minutes per session: Not on file  . Stress: Not on file    Relationships  . Social connections:    Talks on phone: Not on file    Gets together: Not on file    Attends religious service: Not on file    Active member of club or organization: Not on file    Attends meetings of clubs or organizations: Not on file    Relationship status: Not on file  Other Topics Concern  . Not on file  Social History Narrative  . Not on file   Allergies  Allergen Reactions  . Eggs Or Egg-Derived Products Nausea And Vomiting  . Glucosamine Forte [Nutritional Supplements] Rash  . Influenza Vaccines Rash  . Shellfish Allergy Hives and Rash   Family History  Problem Relation Age of Onset  . Breast cancer Mother   . Hypertension Mother   . Cancer Mother   . Alcohol abuse Father   . Hypertension Father   . Hypertension Brother   . Hypertension Brother      Past medical history, social, surgical and family history all reviewed in electronic medical record.  No pertanent information unless stated regarding to the chief complaint.   Review of Systems:Review of systems updated and as accurate as of 12/20/17  No headache, visual changes, nausea, vomiting,  diarrhea, constipation, dizziness, abdominal pain, skin rash, fevers, chills, night sweats, weight loss, swollen lymph nodes, body aches,  chest pain, shortness of breath, mood changes.  Positive muscle aches and joint swelling  Objective  Blood pressure (!) 138/100, height 5\' 11"  (1.803 m), weight 198 lb (89.8 kg). Systems examined below as of 12/20/17   General: No apparent distress alert and oriented x3 mood and affect normal, dressed appropriately.  HEENT: Pupils equal, extraocular movements intact  Respiratory: Patient's speak in full sentences and does not appear short of breath  Cardiovascular: No lower extremity edema, non tender, no erythema  Skin: Warm dry intact with no signs of infection or rash on extremities or on axial skeleton.  Abdomen: Soft nontender  Neuro: Cranial nerves II through XII  are intact, neurovascularly intact in all extremities with 2+ DTRs and 2+ pulses.  Lymph: No lymphadenopathy of posterior or anterior cervical chain or axillae bilaterally.  Gait antalgic MSK:  Non tender with full range of motion and good stability and symmetric strength and tone of shoulders, elbows, wrist, hip, knee and ankles bilaterally.  Knee: Left valgus deformity noted. Large thigh to calf ratio.  Large effusion noted Tender to palpation over medial and PF joint line.  Decreased range of motion lacking last 15 degrees of flexion and 5 degrees of extension lateral tracking of the patella noted instability with valgus force.  painful patellar compression. Patellar glide with moderate crepitus. Patellar and quadriceps tendons unremarkable. Hamstring and quadriceps strength is normal. Contralateral knee shows minimal arthritic changes  Procedure: Real-time Ultrasound Guided Injection of left knee Device: GE Logiq Q7 Ultrasound guided injection is preferred based studies that show increased duration, increased effect, greater accuracy, decreased procedural pain, increased response rate, and decreased cost with ultrasound guided versus blind injection.  Verbal informed consent obtained.  Time-out conducted.  Noted no overlying erythema, induration, or other signs of local infection.  Skin prepped in a sterile fashion.  Local anesthesia: Topical Ethyl chloride.  With sterile technique and under real time ultrasound guidance: With a 22-gauge 2 inch needle patient was injected with 4 cc of 0.5% Marcaine and aspirated 40 cc of strawlike color fluid then injected 1 cc of Kenalog 40 mg/dL. This was from a superior lateral approach.  Completed without difficulty  Pain immediately resolved suggesting accurate placement of the medication.  Advised to call if fevers/chills, erythema, induration, drainage, or persistent bleeding.  Images permanently stored and available for review in the ultrasound  unit.  Impression: Technically successful ultrasound guided injection.    Impression and Recommendations:     This case required medical decision making of moderate complexity.      Note: This dictation was prepared with Dragon dictation along with smaller phrase technology. Any transcriptional errors that result from this process are unintentional.

## 2017-12-20 ENCOUNTER — Ambulatory Visit (INDEPENDENT_AMBULATORY_CARE_PROVIDER_SITE_OTHER)
Admission: RE | Admit: 2017-12-20 | Discharge: 2017-12-20 | Disposition: A | Discharge status: Home / Self Care | Payer: 59 | Source: Ambulatory Visit | Attending: Family Medicine | Admitting: Family Medicine

## 2017-12-20 ENCOUNTER — Ambulatory Visit: Payer: 59 | Admitting: Family Medicine

## 2017-12-20 ENCOUNTER — Ambulatory Visit: Payer: Self-pay

## 2017-12-20 ENCOUNTER — Encounter: Payer: Self-pay | Admitting: Family Medicine

## 2017-12-20 VITALS — BP 138/100 | Ht 71.0 in | Wt 198.0 lb

## 2017-12-20 DIAGNOSIS — M25562 Pain in left knee: Secondary | ICD-10-CM | POA: Diagnosis not present

## 2017-12-20 DIAGNOSIS — G890 Central pain syndrome: Secondary | ICD-10-CM | POA: Diagnosis not present

## 2017-12-20 DIAGNOSIS — G8929 Other chronic pain: Secondary | ICD-10-CM

## 2017-12-20 DIAGNOSIS — M1712 Unilateral primary osteoarthritis, left knee: Secondary | ICD-10-CM

## 2017-12-20 DIAGNOSIS — M25561 Pain in right knee: Secondary | ICD-10-CM | POA: Diagnosis not present

## 2017-12-20 MED ORDER — DICLOFENAC SODIUM 2 % TD SOLN: 2.0000 g | Freq: Two times a day (BID) | TRANSDERMAL | 3 refills | Status: DC

## 2017-12-20 NOTE — Assessment & Plan Note (Signed)
Injected today.  Discussed icing regimen and home exercise.  Discussed which activities to do which wants to avoid.  Increase activity slowly over the course the next several days.  Discussed possible need for Visco supplementation and formal physical therapy.  Patient will follow-up with me again in 4 to 6 weeks

## 2017-12-20 NOTE — Patient Instructions (Signed)
Good ot see you  Xray downstairs Brace with activity  pennsaid pinkie amount topically 2 times daily as needed.  Ice 20 minutes 2 times daily. Usually after activity and before bed. Exercises 3 times a week.  See me again in 4 weeks so we know you are good to go for your hiking trip

## 2017-12-27 ENCOUNTER — Ambulatory Visit: Payer: 59 | Admitting: Family Medicine

## 2017-12-29 MED FILL — NYSTATIN-TRIAMCINOLONE OINT: 100000-0.1 | 10 days supply | Qty: 15 | Fill #0

## 2018-01-17 NOTE — Progress Notes (Signed)
Tawana ScaleZach Rhea Thrun D.O. [facility] [address] [place], [place] [address] Phone: [phone] Subjective:      CC: Left knee pain follow-up  WUX:[id number]:Subjective  Ruth Medina is a 52 y.o. female coming in with complaint of left knee pain. States her knee is doing better. Patient has had significant arthritic changes of the knee.  Did have effusion previously.  Given an injection.  States that she is feeling 90% better.  The only time she notices it when she tries to go up or down hills now.  Otherwise seems to be doing relatively well.    Past Medical History:  Diagnosis Date  . 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 FRACTURE, HAND, LEFT 11/29/2007  . FRACTURE, PELVIS 11/29/2007  . HYPERLIPIDEMIA 01/06/2010  . SINUSITIS- ACUTE-NOS 10/12/2008   Past Surgical History:  Procedure Laterality Date  . aspiration of left knee    . Left hand surgery     Dr. Eulah PontMurphy   Social History   Socioeconomic History  . Marital status: Single    Spouse name: Not on file  . Number of children: Not on file  . Years of education: Not on file  . Highest education level: Not on file  Occupational History  . Not on file  Social Needs  . Financial resource strain: Not on file  . Food insecurity:    Worry: Not on file    Inability: Not on file  . Transportation needs:    Medical: Not on file    Non-medical: Not on file  Tobacco Use  . Smoking status: Never Smoker  . Smokeless tobacco: Never Used  Substance and Sexual Activity  . Alcohol use: No  . Drug use: No  . Sexual activity: Never    Birth control/protection: Diaphragm  Lifestyle  . Physical activity:    Days per week: Not on file    Minutes per session: Not on file  . Stress: Not on file  Relationships  . Social connections:    Talks on phone: Not on file    Gets together: Not on file    Attends religious service: Not on file    Active member of club or organization: Not on file    Attends meetings of clubs or organizations:  Not on file    Relationship status: Not on file  Other Topics Concern  . Not on file  Social History Narrative  . Not on file   Allergies  Allergen Reactions  . Eggs Or Egg-Derived Products Nausea And Vomiting  . Glucosamine Forte [Nutritional Supplements] Rash  . Influenza Vaccines Rash  . Shellfish Allergy Hives and Rash   Family History  Problem Relation Age of Onset  . Breast cancer Mother   . Hypertension Mother   . Cancer Mother   . Alcohol abuse Father   . Hypertension Father   . Hypertension Brother   . Hypertension Brother      Past medical history, social, surgical and family history all reviewed in electronic medical record.  No pertanent information unless stated regarding to the chief complaint.   Review of Systems:Review of systems updated and as accurate as of 01/18/18  No headache, visual changes, nausea, vomiting, diarrhea, constipation, dizziness, abdominal pain, skin rash, fevers, chills, night sweats, weight loss, swollen lymph nodes, body aches, joint swelling, chest pain, shortness of breath, mood changes.  Positive muscle aches  Objective  Blood pressure 100/80, pulse 87, height 5\' 11"  (1.803 m), weight 191 lb (86.6 kg), SpO2  97 %. Systems examined below as of 01/18/18   General: No apparent distress alert and oriented x3 mood and affect normal, dressed appropriately.  HEENT: Pupils equal, extraocular movements intact  Respiratory: Patient's speak in full sentences and does not appear short of breath  Cardiovascular: No lower extremity edema, non tender, no erythema  Skin: Warm dry intact with no signs of infection or rash on extremities or on axial skeleton.  Abdomen: Soft nontender  Neuro: Cranial nerves II through XII are intact, neurovascularly intact in all extremities with 2+ DTRs and 2+ pulses.  Lymph: No lymphadenopathy of posterior or anterior cervical chain or axillae bilaterally.  Gait normal with good balance and coordination.  MSK:  Non  tender with full range of motion and good stability and symmetric strength and tone of shoulders, elbows, wrist, hip, and ankles bilaterally.  Left knee exam still does have an abnormal thigh to calf ratio.  Patient is minimally tender over the patellofemoral and the medial joint line.  Mild crepitus noted.  Mild instability with valgus force.  No swelling noted.  Full range of motion.  Strength is intact and symmetric Contralateral knee mild arthritic changes as well    Impression and Recommendations:     This case required medical decision making of moderate complexity.      Note: This dictation was prepared with Dragon dictation along with smaller phrase technology. Any transcriptional errors that result from this process are unintentional.

## 2018-01-18 ENCOUNTER — Encounter: Payer: Self-pay | Admitting: Family Medicine

## 2018-01-18 ENCOUNTER — Ambulatory Visit: Payer: 59 | Admitting: Family Medicine

## 2018-01-18 DIAGNOSIS — M1712 Unilateral primary osteoarthritis, left knee: Secondary | ICD-10-CM | POA: Diagnosis not present

## 2018-01-18 MED ORDER — PREDNISONE 50 MG PO TABS: 50.0000 mg | ORAL_TABLET | Freq: Every day | ORAL | 0 refills | Status: DC

## 2018-01-18 MED FILL — predniSONE 50 MG TABS: 50 | 7 days supply | Qty: 7 | Fill #0

## 2018-01-18 NOTE — Assessment & Plan Note (Signed)
Responded well to injection 1 month ago.  Patient will be traveling out of the state in 1 month.  Follow-up with me for this.  Prednisone given in case patient has an exacerbation.  Encourage patient to continue the home exercises icing regimen and topical anti-inflammatories.  Once again follow-up in 4 weeks

## 2018-01-18 NOTE — Patient Instructions (Addendum)
Good to see you  Ice 20 minutes 2 times daily. Usually after activity and before bed. Wear brace maybe with walking Prednisone daily for 7 days daily IF in severe pain  Kepe trucking along and increase activity as tolerated.  MAke an appointment before the trip but if doing fine then cancel.

## 2018-02-17 ENCOUNTER — Ambulatory Visit: Payer: 59 | Admitting: Family Medicine

## 2018-04-18 ENCOUNTER — Other Ambulatory Visit: Payer: Self-pay | Admitting: Internal Medicine

## 2018-04-18 MED FILL — DICLOFENAC SOD EC 75 MG TAB: 75 | 90 days supply | Qty: 180 | Fill #0

## 2018-05-03 DIAGNOSIS — Z6828 Body mass index (BMI) 28.0-28.9, adult: Secondary | ICD-10-CM | POA: Diagnosis not present

## 2018-05-03 DIAGNOSIS — Z124 Encounter for screening for malignant neoplasm of cervix: Secondary | ICD-10-CM | POA: Diagnosis not present

## 2018-05-03 DIAGNOSIS — Z1231 Encounter for screening mammogram for malignant neoplasm of breast: Secondary | ICD-10-CM | POA: Diagnosis not present

## 2018-05-03 DIAGNOSIS — Z01419 Encounter for gynecological examination (general) (routine) without abnormal findings: Secondary | ICD-10-CM | POA: Diagnosis not present

## 2018-05-04 DIAGNOSIS — Z124 Encounter for screening for malignant neoplasm of cervix: Secondary | ICD-10-CM | POA: Diagnosis not present

## 2018-06-03 MED FILL — NYSTATIN-TRIAMCINOLONE OINT: 100000-0.1 | 10 days supply | Qty: 30 | Fill #0

## 2018-06-08 ENCOUNTER — Encounter: Payer: Self-pay | Admitting: Internal Medicine

## 2018-06-13 ENCOUNTER — Other Ambulatory Visit (INDEPENDENT_AMBULATORY_CARE_PROVIDER_SITE_OTHER): Payer: 59

## 2018-06-13 DIAGNOSIS — Z0000 Encounter for general adult medical examination without abnormal findings: Secondary | ICD-10-CM

## 2018-06-13 LAB — URINALYSIS, ROUTINE W REFLEX MICROSCOPIC
Bilirubin Urine: NEGATIVE
Hgb urine dipstick: NEGATIVE
Ketones, ur: NEGATIVE
Nitrite: NEGATIVE
Specific Gravity, Urine: 1.015 (ref 1.000–1.030)
Total Protein, Urine: NEGATIVE
Urine Glucose: NEGATIVE
Urobilinogen, UA: 0.2 (ref 0.0–1.0)
pH: 5.5 (ref 5.0–8.0)

## 2018-06-13 LAB — LIPID PANEL
Cholesterol: 271 mg/dL — ABNORMAL HIGH (ref 0–200)
HDL: 58.3 mg/dL (ref >39.00)
LDL Cholesterol: 197 mg/dL — ABNORMAL HIGH (ref 0–99)
NONHDL: 212.66
Total CHOL/HDL Ratio: 5
Triglycerides: 77 mg/dL (ref 0.0–149.0)
VLDL: 15.4 mg/dL (ref 0.0–40.0)

## 2018-06-13 LAB — BASIC METABOLIC PANEL
BUN: 13 mg/dL (ref 6–23)
CO2: 28 mEq/L (ref 19–32)
Calcium: 9.8 mg/dL (ref 8.4–10.5)
Chloride: 103 mEq/L (ref 96–112)
Creatinine, Ser: 1 mg/dL (ref 0.40–1.20)
GFR: 74.22 mL/min (ref >60.00)
GLUCOSE: 94 mg/dL (ref 70–99)
Potassium: 4.2 mEq/L (ref 3.5–5.1)
Sodium: 139 mEq/L (ref 135–145)

## 2018-06-13 LAB — CBC WITH DIFFERENTIAL/PLATELET
BASOS ABS: 0.1 10*3/uL (ref 0.0–0.1)
Basophils Relative: 1.4 % (ref 0.0–3.0)
EOS ABS: 0.4 10*3/uL (ref 0.0–0.7)
Eosinophils Relative: 9.8 % — ABNORMAL HIGH (ref 0.0–5.0)
HCT: 34.8 % — ABNORMAL LOW (ref 36.0–46.0)
Hemoglobin: 10.9 g/dL — ABNORMAL LOW (ref 12.0–15.0)
LYMPHS ABS: 1.5 10*3/uL (ref 0.7–4.0)
Lymphocytes Relative: 32.8 % (ref 12.0–46.0)
MCHC: 31.4 g/dL (ref 30.0–36.0)
MCV: 68.7 fl — ABNORMAL LOW (ref 78.0–100.0)
MONO ABS: 0.4 10*3/uL (ref 0.1–1.0)
Monocytes Relative: 8.3 % (ref 3.0–12.0)
NEUTROS PCT: 47.7 % (ref 43.0–77.0)
Neutro Abs: 2.2 10*3/uL (ref 1.4–7.7)
Platelets: 345 10*3/uL (ref 150.0–400.0)
RBC: 5.07 Mil/uL (ref 3.87–5.11)
RDW: 16 % — ABNORMAL HIGH (ref 11.5–15.5)
WBC: 4.6 10*3/uL (ref 4.0–10.5)

## 2018-06-13 LAB — HEPATIC FUNCTION PANEL
ALT: 14 U/L (ref 0–35)
AST: 19 U/L (ref 0–37)
Albumin: 4.2 g/dL (ref 3.5–5.2)
Alkaline Phosphatase: 54 U/L (ref 39–117)
BILIRUBIN TOTAL: 0.4 mg/dL (ref 0.2–1.2)
Bilirubin, Direct: 0.1 mg/dL (ref 0.0–0.3)
Total Protein: 7.4 g/dL (ref 6.0–8.3)

## 2018-06-13 LAB — TSH: TSH: 0.74 u[IU]/mL (ref 0.35–4.50)

## 2018-06-14 ENCOUNTER — Other Ambulatory Visit (INDEPENDENT_AMBULATORY_CARE_PROVIDER_SITE_OTHER): Payer: 59

## 2018-06-14 DIAGNOSIS — D649 Anemia, unspecified: Secondary | ICD-10-CM

## 2018-06-14 LAB — IBC PANEL
Iron: 44 ug/dL (ref 42–145)
Saturation Ratios: 11.1 % — ABNORMAL LOW (ref 20.0–50.0)
Transferrin: 283 mg/dL (ref 212.0–360.0)

## 2018-06-17 ENCOUNTER — Encounter: Payer: Self-pay | Admitting: Internal Medicine

## 2018-06-17 ENCOUNTER — Ambulatory Visit (INDEPENDENT_AMBULATORY_CARE_PROVIDER_SITE_OTHER): Payer: 59 | Admitting: Internal Medicine

## 2018-06-17 VITALS — BP 136/84 | HR 74 | Temp 98.0°F | Ht 71.0 in | Wt 196.0 lb

## 2018-06-17 DIAGNOSIS — D509 Iron deficiency anemia, unspecified: Secondary | ICD-10-CM

## 2018-06-17 DIAGNOSIS — Z114 Encounter for screening for human immunodeficiency virus [HIV]: Secondary | ICD-10-CM

## 2018-06-17 DIAGNOSIS — Z0000 Encounter for general adult medical examination without abnormal findings: Secondary | ICD-10-CM

## 2018-06-17 NOTE — Assessment & Plan Note (Signed)

## 2018-06-17 NOTE — Progress Notes (Signed)
Subjective:    Patient ID: Ruth Medina, female    DOB: 07-07-1965, 53 y.o.   MRN: [id number]  HPI  Here for wellness and f/u;  Overall doing ok;  Pt denies Chest pain, worsening SOB, DOE, wheezing, orthopnea, PND, worsening LE edema, palpitations, dizziness or syncope.  Pt denies neurological change such as new headache, facial or extremity weakness.  Pt denies polydipsia, polyuria, or low sugar symptoms. Pt states overall good compliance with treatment and medications, good tolerability, and has been trying to follow appropriate diet.  Pt denies worsening depressive symptoms, suicidal ideation or panic. No fever, night sweats, wt loss, loss of appetite, or other constitutional symptoms.  Pt states good ability with ADL's, has low fall risk, home safety reviewed and adequate, no other significant changes in hearing or vision, and only occasionally active with exercise. No new complaints.  Does not want statin for elevated chol due to risk of side effects Past Medical History:  Diagnosis Date  . 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 FRACTURE, HAND, LEFT 11/29/2007  . FRACTURE, PELVIS 11/29/2007  . HYPERLIPIDEMIA 01/06/2010  . SINUSITIS- ACUTE-NOS 10/12/2008   Past Surgical History:  Procedure Laterality Date  . aspiration of left knee    . Left hand surgery     Dr. Eulah Pont    reports that she has never smoked. She has never used smokeless tobacco. She reports that she does not drink alcohol or use drugs. family history includes Alcohol abuse in her father; Breast cancer in her mother; Cancer in her mother; Hypertension in her brother, brother, father, and mother. Allergies  Allergen Reactions  . Eggs Or Egg-Derived Products Nausea And Vomiting  . Glucosamine Forte [Nutritional Supplements] Rash  . Influenza Vaccines Rash  . Shellfish Allergy Hives and Rash   Current Outpatient Medications on File Prior to Visit  Medication Sig Dispense Refill  . aspirin 81 MG tablet Take 81 mg by mouth  daily.    . Clobetasol Prop Emollient Base (CLOBETASOL PROPIONATE E) 0.05 % emollient cream Apply 1 application topically 2 (two) times daily as needed. 30 g 0  . diclofenac (VOLTAREN) 75 MG EC tablet TAKE 1 TABLET BY MOUTH 2 TIMES DAILY AS NEEDED FOR PAIN 180 tablet 0  . Diclofenac Sodium 2 % SOLN Apply 1 pump twice daily. 112 g 3  . Diclofenac Sodium 2 % SOLN Place 2 g onto the skin 2 (two) times daily. 112 g 3  . ferrous sulfate 325 (65 FE) MG tablet Take 325 mg by mouth daily with breakfast.    . fish oil-omega-3 fatty acids 1000 MG capsule Take 1 g by mouth daily.    . Multiple Vitamin (MULTIVITAMIN) tablet Take 1 tablet by mouth daily.      . predniSONE (DELTASONE) 50 MG tablet Take 1 tablet (50 mg total) by mouth daily. 7 tablet 0  . vitamin E 400 UNIT capsule Take 400 Units by mouth daily.     No current facility-administered medications on file prior to visit.    Review of Systems Constitutional: Negative for other unusual diaphoresis, sweats, appetite or weight changes HENT: Negative for other worsening hearing loss, ear pain, facial swelling, mouth sores or neck stiffness.   Eyes: Negative for other worsening pain, redness or other visual disturbance.  Respiratory: Negative for other stridor or swelling Cardiovascular: Negative for other palpitations or other chest pain  Gastrointestinal: Negative for worsening diarrhea or loose stools, blood in stool, distention or other pain Genitourinary: Negative for hematuria, flank  pain or other change in urine volume.  Musculoskeletal: Negative for myalgias or other joint swelling.  Skin: Negative for other color change, or other wound or worsening drainage.  Neurological: Negative for other syncope or numbness. Hematological: Negative for other adenopathy or swelling Psychiatric/Behavioral: Negative for hallucinations, other worsening agitation, SI, self-injury, or new decreased concentration All other system neg per pt    Objective:    Physical Exam BP 136/84   Pulse 74   Temp 98 F (36.7 C) (Oral)   Ht 5\' 11"  (1.803 m)   Wt 196 lb (88.9 kg)   SpO2 99%   BMI 27.34 kg/m  VS noted,  Constitutional: Pt is oriented to person, place, and time. Appears well-developed and well-nourished, in no significant distress and comfortable Head: Normocephalic and atraumatic  Eyes: Conjunctivae and EOM are normal. Pupils are equal, round, and reactive to light Right Ear: External ear normal without discharge Left Ear: External ear normal without discharge Nose: Nose without discharge or deformity Mouth/Throat: Oropharynx is without other ulcerations and moist  Neck: Normal range of motion. Neck supple. No JVD present. No tracheal deviation present or significant neck LA or mass Cardiovascular: Normal rate, regular rhythm, normal heart sounds and intact distal pulses.   Pulmonary/Chest: WOB normal and breath sounds without rales or wheezing  Abdominal: Soft. Bowel sounds are normal. NT. No HSM  Musculoskeletal: Normal range of motion. Exhibits no edema Lymphadenopathy: Has no other cervical adenopathy.  Neurological: Pt is alert and oriented to person, place, and time. Pt has normal reflexes. No cranial nerve deficit. Motor grossly intact, Gait intact Skin: Skin is warm and dry. No rash noted or new ulcerations Psychiatric:  Has normal mood and affect. Behavior is normal without agitation No other exam findings Lab Results  Component Value Date   WBC 4.6 06/13/2018   HGB 10.9 (L) 06/13/2018   HCT 34.8 (L) 06/13/2018   PLT 345.0 06/13/2018   GLUCOSE 94 06/13/2018   CHOL 271 (H) 06/13/2018   TRIG 77.0 06/13/2018   HDL 58.30 06/13/2018   LDLDIRECT 181.9 01/06/2010   LDLCALC 197 (H) 06/13/2018   ALT 14 06/13/2018   AST 19 06/13/2018   NA 139 06/13/2018   K 4.2 06/13/2018   CL 103 06/13/2018   CREATININE 1.00 06/13/2018   BUN 13 06/13/2018   CO2 28 06/13/2018   TSH 0.74 06/13/2018        Assessment & Plan:

## 2018-06-17 NOTE — Assessment & Plan Note (Signed)
Now post menopausal with improved iron, cant r/o thallasemia as well with low MCV but will hold on testing for now

## 2018-06-17 NOTE — Patient Instructions (Signed)
Please continue all other medications as before, and refills have been done if requested.  Please have the pharmacy call with any other refills you may need.  Please continue your efforts at being more active, low cholesterol diet, and weight control.  You are otherwise up to date with prevention measures today.  Please keep your appointments with your specialists as you may have planned  Please return in 1 year for your yearly visit, or sooner if needed, with Lab testing done 3-5 days before  

## 2018-06-22 DIAGNOSIS — H52223 Regular astigmatism, bilateral: Secondary | ICD-10-CM | POA: Diagnosis not present

## 2018-06-22 DIAGNOSIS — H5213 Myopia, bilateral: Secondary | ICD-10-CM | POA: Diagnosis not present

## 2018-08-16 ENCOUNTER — Other Ambulatory Visit: Payer: Self-pay | Admitting: Internal Medicine

## 2018-08-17 MED FILL — DICLOFENAC SODIUM 75 MG TAB: 75 | 90 days supply | Qty: 180 | Fill #0

## 2018-09-16 IMAGING — DX DG KNEE COMPLETE 4+V*L*
4 series · 4 of 4 positions shown · non-contrast
Comparison: 10/20/2005.

CLINICAL DATA: Chronic three-month history of RIGHT knee pain.
RIGHT knee effusion which was aspirated earlier today. No known
injuries.

EXAM:
LEFT KNEE - COMPLETE 4+ VIEW

[knee ap]
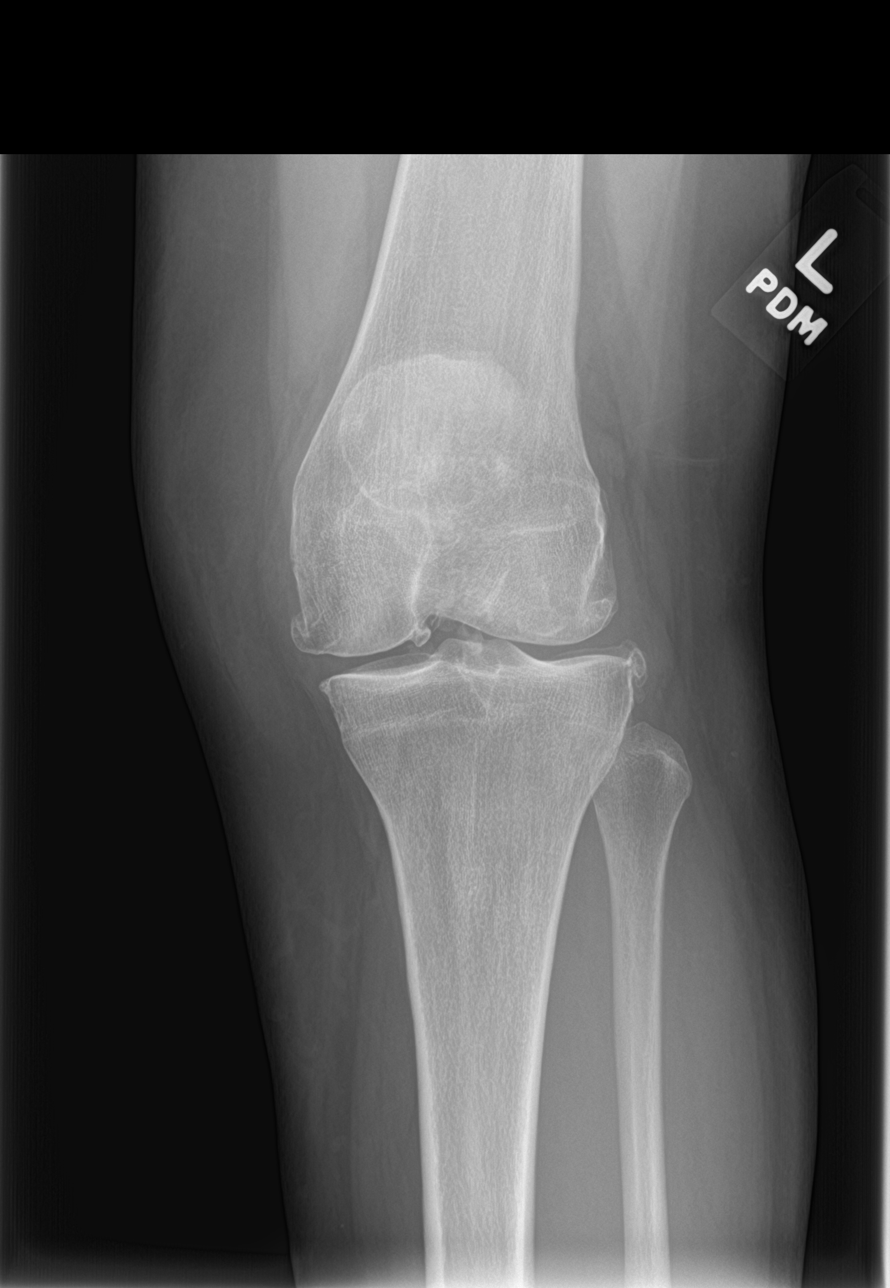

[knee tunnel]
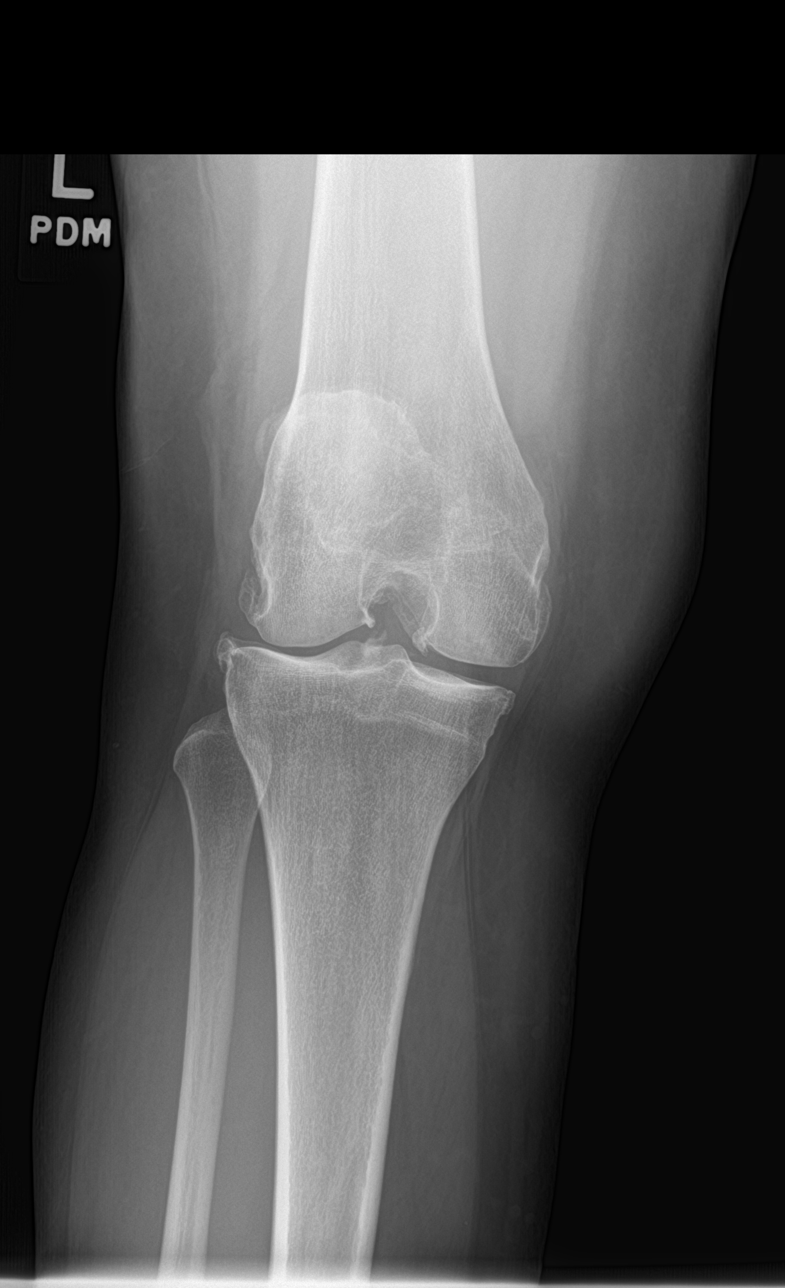

[knee lat]
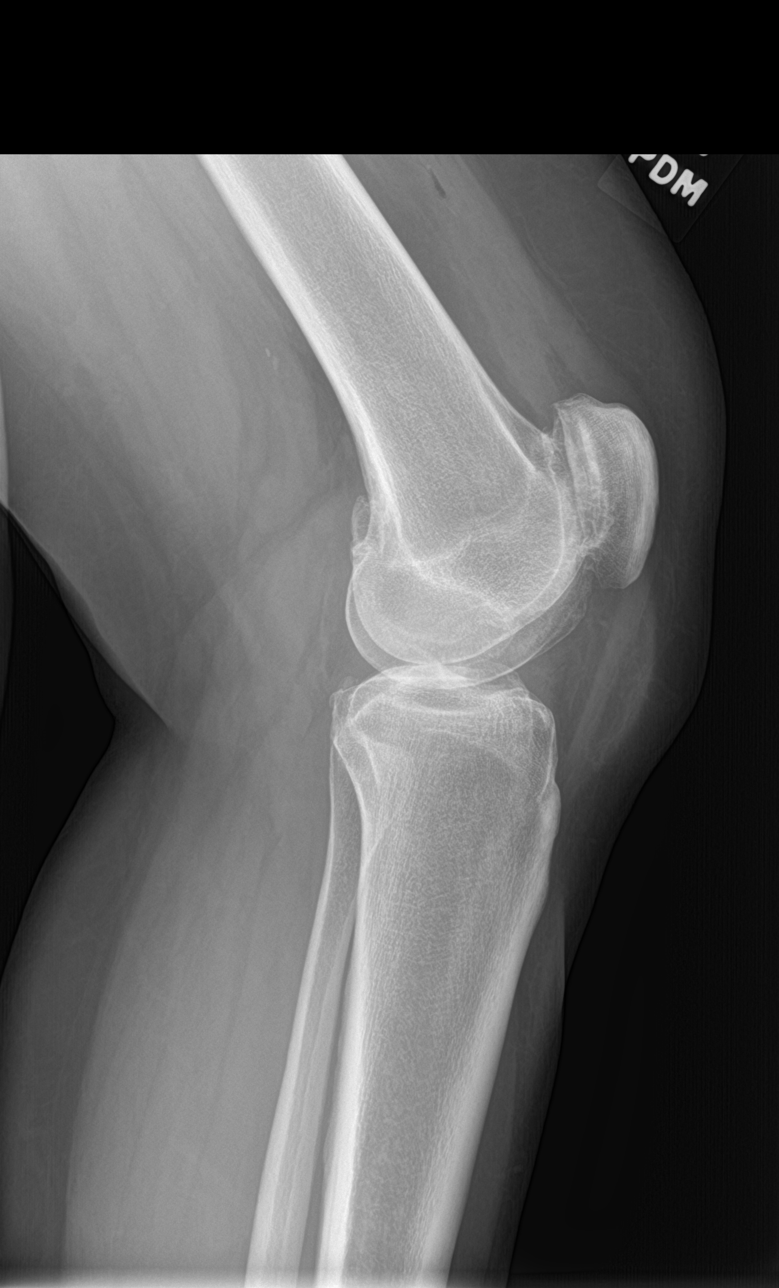

[sunrise]
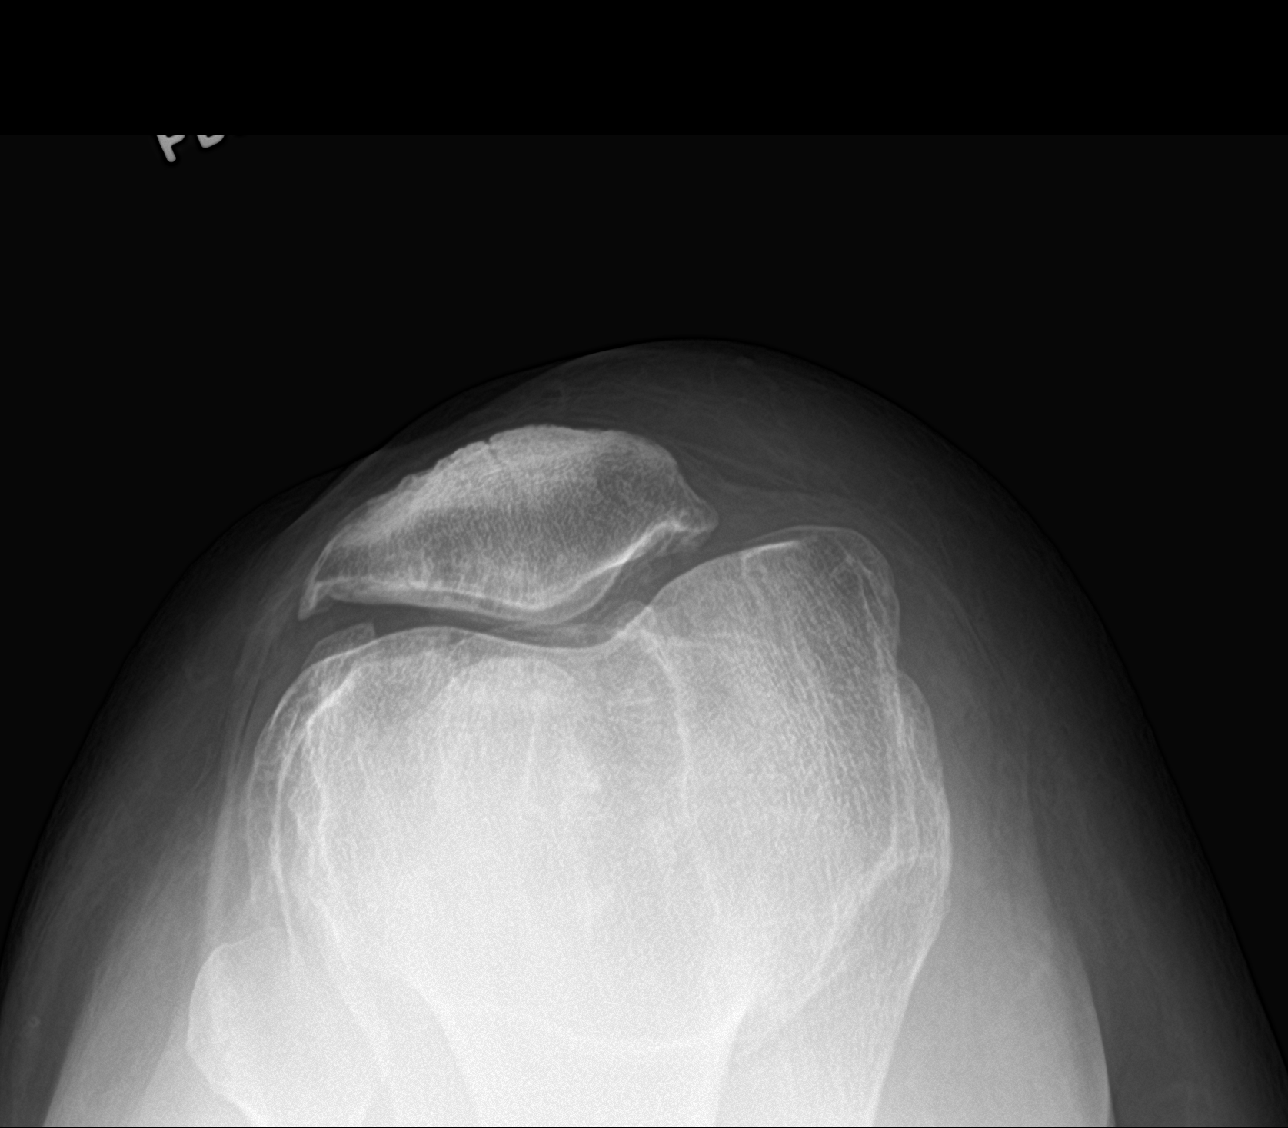

[4 of 4 positions shown; findings below may reference images not displayed]

FINDINGS: Examination was performed with the patient weight-bearing. No
evidence of acute, subacute or healed fracture or dislocation.
Moderate to severe tricompartment joint space narrowing, most severe
in the MEDIAL compartment, with associated hypertrophic spurring,
progressive since 1992. Small residual joint effusion after
aspiration (which accounts for the gas bubble in the joint space).
No evidence of a tracking abnormality on the patellar sunrise view.
IMPRESSION: 1. Moderate to severe tricompartment osteoarthritis, most severe in
the MEDIAL compartment.
2. Small residual joint effusion after aspiration.

## 2019-01-11 ENCOUNTER — Encounter: Payer: Self-pay | Admitting: Family Medicine

## 2019-01-11 MED ORDER — PREDNISONE 50 MG PO TABS: 50.0000 mg | ORAL_TABLET | Freq: Every day | ORAL | 0 refills | Status: DC

## 2019-01-11 MED FILL — predniSONE 50 MG TABS: 50 | 7 days supply | Qty: 7 | Fill #0

## 2019-04-07 MED FILL — DICLOFENAC SODIUM 75 MG TAB: 75 | 90 days supply | Qty: 180 | Fill #1

## 2019-06-07 ENCOUNTER — Ambulatory Visit (INDEPENDENT_AMBULATORY_CARE_PROVIDER_SITE_OTHER): Payer: 59 | Admitting: Family Medicine

## 2019-06-07 ENCOUNTER — Ambulatory Visit (INDEPENDENT_AMBULATORY_CARE_PROVIDER_SITE_OTHER): Payer: 59

## 2019-06-07 ENCOUNTER — Other Ambulatory Visit: Payer: Self-pay

## 2019-06-07 ENCOUNTER — Encounter: Payer: Self-pay | Admitting: Family Medicine

## 2019-06-07 VITALS — BP 132/90 | HR 95 | Ht 71.0 in | Wt 199.0 lb

## 2019-06-07 DIAGNOSIS — M25562 Pain in left knee: Secondary | ICD-10-CM

## 2019-06-07 DIAGNOSIS — M1712 Unilateral primary osteoarthritis, left knee: Secondary | ICD-10-CM | POA: Diagnosis not present

## 2019-06-07 DIAGNOSIS — G8929 Other chronic pain: Secondary | ICD-10-CM

## 2019-06-07 NOTE — Assessment & Plan Note (Signed)
Aspiration done today.  Tolerated the procedure well.  Discussed topical anti-inflammatories, bracing, patient is having some compression of the popliteal area that I think is more likely secondary to the effusion and hopefully this will be beneficial.  Encourage patient to wear the custom brace on a more regular basis.  We will attempt to get patient an adjustable standing desk.  Patient continues to have pain she would be a candidate for possible viscosupplementation.  Follow-up again in 4 weeks if necessary

## 2019-06-07 NOTE — Progress Notes (Signed)
[facility] [address] [place] [address] Phone: [phone] Subjective:   Bruce Donath, am serving as a scribe for Dr. Antoine Primas. This visit occurred during the SARS-CoV-2 public health emergency.  Safety protocols were in place, including screening questions prior to the visit, additional usage of staff PPE, and extensive cleaning of exam room while observing appropriate contact time as indicated for disinfecting solutions.   I'm seeing this patient by the request  of:  Corwin Levins, MD   CC: Left knee pain  [id number]   01/18/2018 Responded well to injection 1 month ago.  Patient will be traveling out of the state in 1 month.  Follow-up with me for this.  Prednisone given in case patient has an exacerbation.  Encourage patient to continue the home exercises icing regimen and topical anti-inflammatories.  Once again follow-up in 4 weeks  Update 06/07/2019 Ruth Medina is a 54 y.o. female coming in with complaint of left knee pain. Patient states that her pain is over the lateral aspect of the knee. Knee stiffens with sitting and becomes painful. Does have relief when she moves around. Uses Voltaren tabs, IBU or Aleve prn.  Has been a year and a half since we have seen patient.  Has been doing very well until recently.       Past Medical History:  Diagnosis Date  . 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 FRACTURE, HAND, LEFT 11/29/2007  . FRACTURE, PELVIS 11/29/2007  . HYPERLIPIDEMIA 01/06/2010  . SINUSITIS- ACUTE-NOS 10/12/2008   Past Surgical History:  Procedure Laterality Date  . aspiration of left knee    . Left hand surgery     Dr. Eulah Pont   Social History   Socioeconomic History  . Marital status: Single    Spouse name: Not on file  . Number of children: Not on file  . Years of education: Not on file  . Highest education level: Not on file  Occupational History  . Not on file  Tobacco Use  . Smoking status: Never  Smoker  . Smokeless tobacco: Never Used  Substance and Sexual Activity  . Alcohol use: No  . Drug use: No  . Sexual activity: Never    Birth control/protection: Diaphragm  Other Topics Concern  . Not on file  Social History Narrative  . Not on file   Social Determinants of Health   Financial Resource Strain:   . Difficulty of Paying Living Expenses: Not on file  Food Insecurity:   . Worried About Programme researcher, broadcasting/film/video in the Last Year: Not on file  . Ran Out of Food in the Last Year: Not on file  Transportation Needs:   . Lack of Transportation (Medical): Not on file  . Lack of Transportation (Non-Medical): Not on file  Physical Activity:   . Days of Exercise per Week: Not on file  . Minutes of Exercise per Session: Not on file  Stress:   . Feeling of Stress : Not on file  Social Connections:   . Frequency of Communication with Friends and Family: Not on file  . Frequency of Social Gatherings with Friends and Family: Not on file  . Attends Religious Services: Not on file  . Active Member of Clubs or Organizations: Not on file  . Attends Banker Meetings: Not on file  . Marital Status: Not on file   Allergies  Allergen Reactions  . Eggs Or Egg-Derived Products Nausea And Vomiting  .  Glucosamine Forte [Nutritional Supplements] Rash  . Influenza Vaccines Rash  . Shellfish Allergy Hives and Rash   Family History  Problem Relation Age of Onset  . Breast cancer Mother   . Hypertension Mother   . Cancer Mother   . Alcohol abuse Father   . Hypertension Father   . Hypertension Brother   . Hypertension Brother     Current Outpatient Medications (Endocrine & Metabolic):  .  predniSONE (DELTASONE) 50 MG tablet, Take 1 tablet (50 mg total) by mouth daily.    Current Outpatient Medications (Analgesics):  .  aspirin 81 MG tablet, Take 81 mg by mouth daily. .  diclofenac (VOLTAREN) 75 MG EC tablet, TAKE 1 TABLET BY MOUTH 2 TIMES DAILY AS NEEDED FOR  PAIN  Current Outpatient Medications (Hematological):  .  ferrous sulfate 325 (65 FE) MG tablet, Take 325 mg by mouth daily with breakfast.  Current Outpatient Medications (Other):  Marland Kitchen  Clobetasol Prop Emollient Base (CLOBETASOL PROPIONATE E) 0.05 % emollient cream, Apply 1 application topically 2 (two) times daily as needed. .  Diclofenac Sodium 2 % SOLN, Apply 1 pump twice daily. .  Diclofenac Sodium 2 % SOLN, Place 2 g onto the skin 2 (two) times daily. .  fish oil-omega-3 fatty acids 1000 MG capsule, Take 1 g by mouth daily. .  Multiple Vitamin (MULTIVITAMIN) tablet, Take 1 tablet by mouth daily.   .  vitamin E 400 UNIT capsule, Take 400 Units by mouth daily.    Past medical history, social, surgical and family history all reviewed in electronic medical record.  No pertanent information unless stated regarding to the chief complaint.   Review of Systems:  No headache, visual changes, nausea, vomiting, diarrhea, constipation, dizziness, abdominal pain, skin rash, fevers, chills, night sweats, weight loss, swollen lymph nodes, body aches,,  chest pain, shortness of breath, mood changes.  Positive muscle aches, joint swelling  Objective  Blood pressure 132/90, pulse 95, height 5\' 11"  (1.803 m), weight 199 lb (90.3 kg), SpO2 99 %.    General: No apparent distress alert and oriented x3 mood and affect normal, dressed appropriately.  HEENT: Pupils equal, extraocular movements intact  Respiratory: Patient's speak in full sentences and does not appear short of breath  Cardiovascular: No lower extremity edema, non tender, no erythema  Skin: Warm dry intact with no signs of infection or rash on extremities or on axial skeleton.  Abdomen: Soft nontender  Neuro: Cranial nerves II through XII are intact, neurovascularly intact in all extremities with 2+ DTRs and 2+ pulses.  Lymph: No lymphadenopathy of posterior or anterior cervical chain or axillae bilaterally.  Gait mild antalgic MSK:   tender with full range of motion and good stability and symmetric strength and tone of shoulders, elbows, wrist, hip, and ankles bilaterally.   Knee: Left valgus deformity noted. Large thigh to calf ratio.  Effusion noted Tender to palpation over medial and PF joint line.  ROM lacks last 5 degrees of flexion instability with valgus and varus force.  painful patellar compression. Patellar glide with moderate crepitus. Patellar and quadriceps tendons unremarkable. Hamstring and quadriceps strength is normal. Contralateral knee shows mild arthritic changes  Procedure: Real-time Ultrasound Guided Injection of left knee Device: GE Logiq Q7 Ultrasound guided injection is preferred based studies that show increased duration, increased effect, greater accuracy, decreased procedural pain, increased response rate, and decreased cost with ultrasound guided versus blind injection.  Verbal informed consent obtained.  Time-out conducted.  Noted no overlying erythema, induration,  or other signs of local infection.  Skin prepped in a sterile fashion.  Local anesthesia: Topical Ethyl chloride.  With sterile technique and under real time ultrasound guidance: With a 22-gauge 2 inch needle patient was injected with 4 cc of 0.5% Marcaine and aspirated 35 cc of straw-colored fluid then injected 1 cc of Kenalog 40 mg/dL. This was from a superior lateral approach.  Completed without difficulty  Pain immediately resolved suggesting accurate placement of the medication.  Advised to call if fevers/chills, erythema, induration, drainage, or persistent bleeding.  Images permanently stored and available for review in the ultrasound unit.  Impression: Technically successful ultrasound guided injection.   Impression and Recommendations:     This case required medical decision making of moderate complexity. The above documentation has been reviewed and is accurate and complete Lyndal Pulley, DO       Note:  This dictation was prepared with Dragon dictation along with smaller phrase technology. Any transcriptional errors that result from this process are unintentional.

## 2019-06-07 NOTE — Patient Instructions (Signed)
Drained knee today Pennsaid 2x a day as needed Exercises 3x a week Ice 20 min, 2x a day See me again in 4 weeks

## 2019-06-14 ENCOUNTER — Encounter: Payer: Self-pay | Admitting: Internal Medicine

## 2019-06-15 ENCOUNTER — Other Ambulatory Visit (INDEPENDENT_AMBULATORY_CARE_PROVIDER_SITE_OTHER): Payer: 59

## 2019-06-15 ENCOUNTER — Encounter: Payer: Self-pay | Admitting: Internal Medicine

## 2019-06-15 DIAGNOSIS — Z0000 Encounter for general adult medical examination without abnormal findings: Secondary | ICD-10-CM | POA: Diagnosis not present

## 2019-06-15 DIAGNOSIS — Z114 Encounter for screening for human immunodeficiency virus [HIV]: Secondary | ICD-10-CM

## 2019-06-15 LAB — HEPATIC FUNCTION PANEL
ALT: 14 U/L (ref 0–35)
AST: 15 U/L (ref 0–37)
Albumin: 4.2 g/dL (ref 3.5–5.2)
Alkaline Phosphatase: 56 U/L (ref 39–117)
Bilirubin, Direct: 0 mg/dL (ref 0.0–0.3)
Total Bilirubin: 0.3 mg/dL (ref 0.2–1.2)
Total Protein: 7.3 g/dL (ref 6.0–8.3)

## 2019-06-15 LAB — CBC WITH DIFFERENTIAL/PLATELET
Basophils Absolute: 0 10*3/uL (ref 0.0–0.1)
Basophils Relative: 0.8 % (ref 0.0–3.0)
Eosinophils Absolute: 0.3 10*3/uL (ref 0.0–0.7)
Eosinophils Relative: 5.4 % — ABNORMAL HIGH (ref 0.0–5.0)
HCT: 34.6 % — ABNORMAL LOW (ref 36.0–46.0)
Hemoglobin: 10.8 g/dL — ABNORMAL LOW (ref 12.0–15.0)
Lymphocytes Relative: 24.9 % (ref 12.0–46.0)
Lymphs Abs: 1.5 10*3/uL (ref 0.7–4.0)
MCHC: 31.1 g/dL (ref 30.0–36.0)
MCV: 69.4 fl — ABNORMAL LOW (ref 78.0–100.0)
Monocytes Absolute: 0.5 10*3/uL (ref 0.1–1.0)
Monocytes Relative: 8.3 % (ref 3.0–12.0)
Neutro Abs: 3.6 10*3/uL (ref 1.4–7.7)
Neutrophils Relative %: 60.6 % (ref 43.0–77.0)
Platelets: 356 10*3/uL (ref 150.0–400.0)
RBC: 4.99 Mil/uL (ref 3.87–5.11)
RDW: 16 % — ABNORMAL HIGH (ref 11.5–15.5)
WBC: 5.9 10*3/uL (ref 4.0–10.5)

## 2019-06-15 LAB — URINALYSIS, ROUTINE W REFLEX MICROSCOPIC
Bilirubin Urine: NEGATIVE
Hgb urine dipstick: NEGATIVE
Ketones, ur: NEGATIVE
Nitrite: NEGATIVE
RBC / HPF: NONE SEEN (ref 0–?)
Specific Gravity, Urine: 1.025 (ref 1.000–1.030)
Total Protein, Urine: NEGATIVE
Urine Glucose: NEGATIVE
Urobilinogen, UA: 0.2 (ref 0.0–1.0)
pH: 5.5 (ref 5.0–8.0)

## 2019-06-15 LAB — TSH: TSH: 1.38 u[IU]/mL (ref 0.35–4.50)

## 2019-06-15 LAB — LIPID PANEL
Cholesterol: 254 mg/dL — ABNORMAL HIGH (ref 0–200)
HDL: 61.9 mg/dL (ref >39.00)
LDL Cholesterol: 171 mg/dL — ABNORMAL HIGH (ref 0–99)
NonHDL: 192.09
Total CHOL/HDL Ratio: 4
Triglycerides: 107 mg/dL (ref 0.0–149.0)
VLDL: 21.4 mg/dL (ref 0.0–40.0)

## 2019-06-15 LAB — BASIC METABOLIC PANEL
BUN: 21 mg/dL (ref 6–23)
CO2: 25 mEq/L (ref 19–32)
Calcium: 9.5 mg/dL (ref 8.4–10.5)
Chloride: 103 mEq/L (ref 96–112)
Creatinine, Ser: 1.06 mg/dL (ref 0.40–1.20)
GFR: 65.05 mL/min (ref >60.00)
Glucose, Bld: 70 mg/dL (ref 70–99)
Potassium: 4.3 mEq/L (ref 3.5–5.1)
Sodium: 137 mEq/L (ref 135–145)

## 2019-06-15 NOTE — Telephone Encounter (Signed)
Pt asking for Doxy visit please assist with appt

## 2019-06-16 LAB — HIV ANTIBODY (ROUTINE TESTING W REFLEX): HIV 1&2 Ab, 4th Generation: NONREACTIVE

## 2019-06-21 ENCOUNTER — Ambulatory Visit (INDEPENDENT_AMBULATORY_CARE_PROVIDER_SITE_OTHER): Payer: 59 | Admitting: Internal Medicine

## 2019-06-21 ENCOUNTER — Encounter: Payer: Self-pay | Admitting: Internal Medicine

## 2019-06-21 DIAGNOSIS — Z0000 Encounter for general adult medical examination without abnormal findings: Secondary | ICD-10-CM

## 2019-06-21 MED ORDER — DICLOFENAC SODIUM 75 MG PO TBEC: DELAYED_RELEASE_TABLET | ORAL | 3 refills | Status: DC

## 2019-06-21 NOTE — Assessment & Plan Note (Signed)

## 2019-06-21 NOTE — Progress Notes (Signed)
Patient ID: Ruth Medina, female   DOB: 1965/07/06, 54 y.o.   MRN: [id number]  Virtual Visit via Video Note  I connected with Ruth Medina on 06/21/19 at  8:00 AM EST by a video enabled telemedicine application and verified that I am speaking with the correct person using two identifiers.  Location: Patient: at home Provider: at office   I discussed the limitations of evaluation and management by telemedicine and the availability of in person appointments. The patient expressed understanding and agreed to proceed.  History of Present Illness: Here for wellness and f/u;  Overall doing ok;  Pt denies Chest pain, worsening SOB, DOE, wheezing, orthopnea, PND, worsening LE edema, palpitations, dizziness or syncope.  Pt denies neurological change such as new headache, facial or extremity weakness.  Pt denies polydipsia, polyuria, or low sugar symptoms. Pt states overall good compliance with treatment and medications, good tolerability, and has been trying to follow appropriate diet.  Pt denies worsening depressive symptoms, suicidal ideation or panic. No fever, night sweats, wt loss, loss of appetite, or other constitutional symptoms.  Pt states good ability with ADL's, has low fall risk, home safety reviewed and adequate, no other significant changes in hearing or vision, and only occasionally active with exercise.  BP Readings from Last 3 Encounters:  06/07/19 132/90  06/17/18 136/84  01/18/18 100/80   Wt Readings from Last 3 Encounters:  06/07/19 199 lb (90.3 kg)  06/17/18 196 lb (88.9 kg)  01/18/18 191 lb (86.6 kg)  S/p Dr Tamala Julian with aspirate and cortisone and now 100% better.   Past Medical History:  Diagnosis Date  . 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 FRACTURE, HAND, LEFT 11/29/2007  . FRACTURE, PELVIS 11/29/2007  . HYPERLIPIDEMIA 01/06/2010  . SINUSITIS- ACUTE-NOS 10/12/2008   Past Surgical History:  Procedure Laterality Date  . aspiration of left knee    . Left hand surgery      Dr. Percell Miller    reports that she has never smoked. She has never used smokeless tobacco. She reports that she does not drink alcohol or use drugs. family history includes Alcohol abuse in her father; Breast cancer in her mother; Cancer in her mother; Hypertension in her brother, brother, father, and mother. Allergies  Allergen Reactions  . Eggs Or Egg-Derived Products Nausea And Vomiting  . Glucosamine Forte [Nutritional Supplements] Rash  . Influenza Vaccines Rash  . Shellfish Allergy Hives and Rash   Current Outpatient Medications on File Prior to Visit  Medication Sig Dispense Refill  . aspirin 81 MG tablet Take 81 mg by mouth daily.    . Clobetasol Prop Emollient Base (CLOBETASOL PROPIONATE E) 0.05 % emollient cream Apply 1 application topically 2 (two) times daily as needed. 30 g 0  . Diclofenac Sodium 2 % SOLN Apply 1 pump twice daily. 112 g 3  . Diclofenac Sodium 2 % SOLN Place 2 g onto the skin 2 (two) times daily. 112 g 3  . ferrous sulfate 325 (65 FE) MG tablet Take 325 mg by mouth daily with breakfast.    . fish oil-omega-3 fatty acids 1000 MG capsule Take 1 g by mouth daily.    . Multiple Vitamin (MULTIVITAMIN) tablet Take 1 tablet by mouth daily.       No current facility-administered medications on file prior to visit.   Observations/Objective: .Alert, NAD, appropriate mood and affect, resps normal, cn 2-12 intact, moves all 4s, no visible rash or swelling Lab Results  Component Value Date   WBC 5.9  06/15/2019   HGB 10.8 (L) 06/15/2019   HCT 34.6 (L) 06/15/2019   PLT 356.0 06/15/2019   GLUCOSE 70 06/15/2019   CHOL 254 (H) 06/15/2019   TRIG 107.0 06/15/2019   HDL 61.90 06/15/2019   LDLDIRECT 181.9 01/06/2010   LDLCALC 171 (H) 06/15/2019   ALT 14 06/15/2019   AST 15 06/15/2019   NA 137 06/15/2019   K 4.3 06/15/2019   CL 103 06/15/2019   CREATININE 1.06 06/15/2019   BUN 21 06/15/2019   CO2 25 06/15/2019   TSH 1.38 06/15/2019   Assessment and Plan: See  notes  Follow Up Instructions: See notes   I discussed the assessment and treatment plan with the patient. The patient was provided an opportunity to ask questions and all were answered. The patient agreed with the plan and demonstrated an understanding of the instructions.   The patient was advised to call back or seek an in-person evaluation if the symptoms worsen or if the condition fails to improve as anticipated.   Oliver Barre, MD

## 2019-06-21 NOTE — Patient Instructions (Signed)
Please continue all other medications as before, and refills have been done if requested.  Please have the pharmacy call with any other refills you may need.  Please continue your efforts at being more active, low cholesterol diet, and weight control.  You are otherwise up to date with prevention measures today.  Please keep your appointments with your specialists as you may have planned   

## 2019-06-27 MED FILL — DICLOFENAC SODIUM 75 MG TAB: 75 | 90 days supply | Qty: 180 | Fill #0

## 2019-07-05 ENCOUNTER — Encounter: Payer: Self-pay | Admitting: Internal Medicine

## 2019-07-05 DIAGNOSIS — Z1231 Encounter for screening mammogram for malignant neoplasm of breast: Secondary | ICD-10-CM | POA: Diagnosis not present

## 2019-07-05 DIAGNOSIS — Z6828 Body mass index (BMI) 28.0-28.9, adult: Secondary | ICD-10-CM | POA: Diagnosis not present

## 2019-07-05 DIAGNOSIS — Z01419 Encounter for gynecological examination (general) (routine) without abnormal findings: Secondary | ICD-10-CM | POA: Diagnosis not present

## 2019-07-05 DIAGNOSIS — N9089 Other specified noninflammatory disorders of vulva and perineum: Secondary | ICD-10-CM | POA: Diagnosis not present

## 2019-07-05 MED FILL — NYSTATIN-TRIAMCINOLONE 1000: 100000-0.1 | 14 days supply | Qty: 30 | Fill #0

## 2019-07-06 ENCOUNTER — Ambulatory Visit: Payer: 59 | Admitting: Family Medicine

## 2019-07-07 ENCOUNTER — Encounter: Payer: Self-pay | Admitting: Internal Medicine

## 2019-07-10 ENCOUNTER — Encounter: Payer: Self-pay | Admitting: Family Medicine

## 2019-08-03 ENCOUNTER — Encounter: Payer: Self-pay | Admitting: Internal Medicine

## 2019-10-23 ENCOUNTER — Encounter: Payer: Self-pay | Admitting: Family Medicine

## 2019-10-24 MED ORDER — PREDNISONE 50 MG PO TABS: 50.0000 mg | ORAL_TABLET | Freq: Every day | ORAL | 0 refills | Status: DC

## 2019-10-24 MED FILL — predniSONE 50 MG TABS: 50 | 5 days supply | Qty: 5 | Fill #0

## 2019-12-18 ENCOUNTER — Other Ambulatory Visit: Payer: Self-pay | Admitting: Internal Medicine

## 2019-12-18 MED FILL — NYSTATIN-TRIAMCINOLONE OINT: 100000-0.1 | 14 days supply | Qty: 30 | Fill #1

## 2019-12-18 MED FILL — CLOBETASOL EMOLLIENT 0.05%: 0.05 | 10 days supply | Qty: 30 | Fill #0

## 2020-01-22 MED FILL — DICLOFENAC SODIUM 75 MG TAB: 75 | 90 days supply | Qty: 180 | Fill #1

## 2020-02-05 ENCOUNTER — Encounter: Payer: Self-pay | Admitting: Family Medicine

## 2020-02-07 ENCOUNTER — Ambulatory Visit: Payer: 59 | Admitting: Family Medicine

## 2020-02-07 ENCOUNTER — Encounter: Payer: Self-pay | Admitting: Family Medicine

## 2020-02-07 ENCOUNTER — Other Ambulatory Visit: Payer: Self-pay

## 2020-02-07 DIAGNOSIS — M1712 Unilateral primary osteoarthritis, left knee: Secondary | ICD-10-CM

## 2020-02-07 NOTE — Progress Notes (Signed)
[facility] [address] [place] [address] Phone: [phone] Subjective:   I Ruth Medina am serving as a Neurosurgeon for Dr. Antoine Primas.  This visit occurred during the SARS-CoV-2 public health emergency.  Safety protocols were in place, including screening questions prior to the visit, additional usage of staff PPE, and extensive cleaning of exam room while observing appropriate contact time as indicated for disinfecting solutions.   I'm seeing this patient by the request  of:  Corwin Levins, MD  CC: Left knee pain follow-up  GBT:[id number]   06/07/2019 Aspiration done today.  Tolerated the procedure well.  Discussed topical anti-inflammatories, bracing, patient is having some compression of the popliteal area that I think is more likely secondary to the effusion and hopefully this will be beneficial.  Encourage patient to wear the custom brace on a more regular basis.  We will attempt to get patient an adjustable standing desk.  Patient continues to have pain she would be a candidate for possible viscosupplementation.  Follow-up again in 4 weeks if necessary  Update 02/07/2020 Ruth Medina is a 54 y.o. female coming in with complaint of left knee pain. Patient was hiking in [place] and has had swelling and pain since then. Patient states she is painful today. Pain is in the patellar tendon. Has been icing and the swelling has gone down. Wearing knee brace everyday.        Past Medical History:  Diagnosis Date  . 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 FRACTURE, HAND, LEFT 11/29/2007  . FRACTURE, PELVIS 11/29/2007  . HYPERLIPIDEMIA 01/06/2010  . SINUSITIS- ACUTE-NOS 10/12/2008   Past Surgical History:  Procedure Laterality Date  . aspiration of left knee    . Left hand surgery     Dr. Eulah Pont   Social History   Socioeconomic History  . Marital status: Single    Spouse name: Not on file  . Number of children: Not on file  . Years of  education: Not on file  . Highest education level: Not on file  Occupational History  . Not on file  Tobacco Use  . Smoking status: Never Smoker  . Smokeless tobacco: Never Used  Substance and Sexual Activity  . Alcohol use: No  . Drug use: No  . Sexual activity: Never    Birth control/protection: Diaphragm  Other Topics Concern  . Not on file  Social History Narrative  . Not on file   Social Determinants of Health   Financial Resource Strain:   . Difficulty of Paying Living Expenses: Not on file  Food Insecurity:   . Worried About Programme researcher, broadcasting/film/video in the Last Year: Not on file  . Ran Out of Food in the Last Year: Not on file  Transportation Needs:   . Lack of Transportation (Medical): Not on file  . Lack of Transportation (Non-Medical): Not on file  Physical Activity:   . Days of Exercise per Week: Not on file  . Minutes of Exercise per Session: Not on file  Stress:   . Feeling of Stress : Not on file  Social Connections:   . Frequency of Communication with Friends and Family: Not on file  . Frequency of Social Gatherings with Friends and Family: Not on file  . Attends Religious Services: Not on file  . Active Member of Clubs or Organizations: Not on file  . Attends Banker Meetings: Not on file  . Marital Status: Not on file  Allergies  Allergen Reactions  . Eggs Or Egg-Derived Products Nausea And Vomiting  . Glucosamine Forte [Nutritional Supplements] Rash  . Influenza Vaccines Rash  . Shellfish Allergy Hives and Rash   Family History  Problem Relation Age of Onset  . Breast cancer Mother   . Hypertension Mother   . Cancer Mother   . Alcohol abuse Father   . Hypertension Father   . Hypertension Brother   . Hypertension Brother     Current Outpatient Medications (Endocrine & Metabolic):  .  predniSONE (DELTASONE) 50 MG tablet, Take 1 tablet (50 mg total) by mouth daily.    Current Outpatient Medications (Analgesics):  .  aspirin 81 MG  tablet, Take 81 mg by mouth daily. .  diclofenac (VOLTAREN) 75 MG EC tablet, 1 tab twice per day as needed  Current Outpatient Medications (Hematological):  .  ferrous sulfate 325 (65 FE) MG tablet, Take 325 mg by mouth daily with breakfast.  Current Outpatient Medications (Other):  .  CLOBETASOL PROPIONATE E 0.05 % emollient cream, APPLY TO THE AFFECTED AREA(S) TWICE DAILY AS NEEDED .  Diclofenac Sodium 2 % SOLN, Apply 1 pump twice daily. .  Diclofenac Sodium 2 % SOLN, Place 2 g onto the skin 2 (two) times daily. .  fish oil-omega-3 fatty acids 1000 MG capsule, Take 1 g by mouth daily. .  Multiple Vitamin (MULTIVITAMIN) tablet, Take 1 tablet by mouth daily.     Reviewed prior external information including notes and imaging from  primary care provider As well as notes that were available from care everywhere and other healthcare systems.  Past medical history, social, surgical and family history all reviewed in electronic medical record.  No pertanent information unless stated regarding to the chief complaint.   Review of Systems:  No headache, visual changes, nausea, vomiting, diarrhea, constipation, dizziness, abdominal pain, skin rash, fevers, chills, night sweats, weight loss, swollen lymph nodes, body aches, joint swelling, chest pain, shortness of breath, mood changes. POSITIVE muscle aches  Objective  Blood pressure (!) 148/90, pulse 78, height 5\' 11"  (1.803 m), weight 201 lb (91.2 kg), SpO2 96 %.   General: No apparent distress alert and oriented x3 mood and affect normal, dressed appropriately.  HEENT: Pupils equal, extraocular movements intact  Respiratory: Patient's speak in full sentences and does not appear short of breath  Cardiovascular: No lower extremity edema, non tender, no erythema  Neuro: Cranial nerves II through XII are intact, neurovascularly intact in all extremities with 2+ DTRs and 2+ pulses.  Gait normal with good balance and coordination.  MSK: Left knee  still has the hypertrophy of the fat pad anteriorly.  May be minorly worse.  Some minimal tenderness over the patella tendon.  Lateral tracking of patella noted.  Instability with valgus and varus force of the knee.  Lacks the last 5 degrees of extension.  After informed written and verbal consent, patient was seated on exam table. Left knee was prepped with alcohol swab and utilizing anterolateral approach, patient's left knee space was injected with 4:1  marcaine 0.5%: Kenalog 40mg /dL. Patient tolerated the procedure well without immediate complications.    Impression and Recommendations:     The above documentation has been reviewed and is accurate and complete , DO       Note: This dictation was prepared with Dragon dictation along with smaller phrase technology. Any transcriptional errors that result from this process are unintentional.

## 2020-02-07 NOTE — Assessment & Plan Note (Signed)
Degenerative arthritis of the left knee.  Discussed with patient about the possibility of advanced imaging but with patient having severe arthritis I do not think that it would change medical management.  Injection given today and I think it will help patient significantly.  Just got back from a trip.  Patient does have some mild lower extremity edema bilaterally but no pain in the calf, negative Janee Morn, discussed the potential for clot the patient is having no pain in that vicinity and only on the anterior aspect of the knee.  Discussed with icing regimen.  Chronic problem with exacerbation see me again 6 to 8 weeks

## 2020-02-07 NOTE — Patient Instructions (Signed)
Good to see you  Ice is your friend Continue the brace  Injected the knee and I am very hopeful it will calm down,  See me again in 6 weeks

## 2020-03-20 ENCOUNTER — Ambulatory Visit: Payer: 59 | Admitting: Family Medicine

## 2020-05-13 MED FILL — NYSTATIN-TRIAMCINOLONE OINT: 100000-0.1 | 14 days supply | Qty: 30 | Fill #2

## 2020-05-13 MED FILL — DICLOFENAC SODIUM 75 MG TAB: 75 | 90 days supply | Qty: 180 | Fill #2

## 2020-05-19 ENCOUNTER — Encounter: Payer: Self-pay | Admitting: Internal Medicine

## 2020-05-19 DIAGNOSIS — R300 Dysuria: Secondary | ICD-10-CM

## 2020-05-20 ENCOUNTER — Other Ambulatory Visit (INDEPENDENT_AMBULATORY_CARE_PROVIDER_SITE_OTHER): Payer: 59

## 2020-05-20 DIAGNOSIS — R300 Dysuria: Secondary | ICD-10-CM

## 2020-05-20 LAB — URINALYSIS, ROUTINE W REFLEX MICROSCOPIC
Bilirubin Urine: NEGATIVE
Hgb urine dipstick: NEGATIVE
Ketones, ur: NEGATIVE
Nitrite: NEGATIVE
RBC / HPF: NONE SEEN (ref 0–?)
Specific Gravity, Urine: 1.01 (ref 1.000–1.030)
Total Protein, Urine: NEGATIVE
Urine Glucose: NEGATIVE
Urobilinogen, UA: 0.2 (ref 0.0–1.0)
pH: 6 (ref 5.0–8.0)

## 2020-05-20 NOTE — Addendum Note (Signed)
Addended by: Vincenza Hews on: 05/20/2020 04:03 PM   Modules accepted: Orders

## 2020-05-22 ENCOUNTER — Encounter: Payer: Self-pay | Admitting: Internal Medicine

## 2020-05-23 ENCOUNTER — Other Ambulatory Visit: Payer: Self-pay | Admitting: Internal Medicine

## 2020-05-23 ENCOUNTER — Encounter: Payer: Self-pay | Admitting: Internal Medicine

## 2020-05-23 LAB — URINE CULTURE
MICRO NUMBER:: 11337920
SPECIMEN QUALITY:: ADEQUATE

## 2020-05-23 MED ORDER — CEPHALEXIN 500 MG PO CAPS: 500.0000 mg | ORAL_CAPSULE | Freq: Three times a day (TID) | ORAL | 0 refills | Status: DC

## 2020-05-27 MED FILL — CEPHALEXIN 500 MG CAPSULE: 500 | 10 days supply | Qty: 30 | Fill #0

## 2020-05-29 ENCOUNTER — Encounter: Payer: Self-pay | Admitting: Internal Medicine

## 2020-07-03 ENCOUNTER — Ambulatory Visit: Payer: 59 | Admitting: Internal Medicine

## 2020-07-23 ENCOUNTER — Encounter: Payer: Self-pay | Admitting: Internal Medicine

## 2020-07-23 DIAGNOSIS — Z0000 Encounter for general adult medical examination without abnormal findings: Secondary | ICD-10-CM

## 2020-07-23 DIAGNOSIS — E559 Vitamin D deficiency, unspecified: Secondary | ICD-10-CM

## 2020-07-23 DIAGNOSIS — E538 Deficiency of other specified B group vitamins: Secondary | ICD-10-CM

## 2020-07-23 DIAGNOSIS — D509 Iron deficiency anemia, unspecified: Secondary | ICD-10-CM

## 2020-07-23 NOTE — Telephone Encounter (Signed)
Ok for staff to assist pt with lab appt to have done prior to visit please

## 2020-07-25 ENCOUNTER — Other Ambulatory Visit (INDEPENDENT_AMBULATORY_CARE_PROVIDER_SITE_OTHER): Payer: 59

## 2020-07-25 DIAGNOSIS — D509 Iron deficiency anemia, unspecified: Secondary | ICD-10-CM

## 2020-07-25 DIAGNOSIS — E538 Deficiency of other specified B group vitamins: Secondary | ICD-10-CM

## 2020-07-25 DIAGNOSIS — Z0000 Encounter for general adult medical examination without abnormal findings: Secondary | ICD-10-CM | POA: Diagnosis not present

## 2020-07-25 DIAGNOSIS — E559 Vitamin D deficiency, unspecified: Secondary | ICD-10-CM | POA: Diagnosis not present

## 2020-07-25 LAB — BASIC METABOLIC PANEL
BUN: 16 mg/dL (ref 6–23)
CO2: 24 mEq/L (ref 19–32)
Calcium: 9.6 mg/dL (ref 8.4–10.5)
Chloride: 104 mEq/L (ref 96–112)
Creatinine, Ser: 0.99 mg/dL (ref 0.40–1.20)
GFR: 63.78 mL/min (ref >60.00)
Glucose, Bld: 97 mg/dL (ref 70–99)
Potassium: 4.2 mEq/L (ref 3.5–5.1)
Sodium: 138 mEq/L (ref 135–145)

## 2020-07-25 LAB — URINALYSIS, ROUTINE W REFLEX MICROSCOPIC
Bilirubin Urine: NEGATIVE
Hgb urine dipstick: NEGATIVE
Ketones, ur: NEGATIVE
Leukocytes,Ua: NEGATIVE
Nitrite: NEGATIVE
RBC / HPF: NONE SEEN (ref 0–?)
Specific Gravity, Urine: 1.02 (ref 1.000–1.030)
Total Protein, Urine: NEGATIVE
Urine Glucose: NEGATIVE
Urobilinogen, UA: 0.2 (ref 0.0–1.0)
pH: 6 (ref 5.0–8.0)

## 2020-07-25 LAB — LIPID PANEL
Cholesterol: 252 mg/dL — ABNORMAL HIGH (ref 0–200)
HDL: 55.2 mg/dL (ref >39.00)
LDL Cholesterol: 170 mg/dL — ABNORMAL HIGH (ref 0–99)
NonHDL: 197.23
Total CHOL/HDL Ratio: 5
Triglycerides: 138 mg/dL (ref 0.0–149.0)
VLDL: 27.6 mg/dL (ref 0.0–40.0)

## 2020-07-25 LAB — IBC PANEL
Iron: 54 ug/dL (ref 42–145)
Saturation Ratios: 13.2 % — ABNORMAL LOW (ref 20.0–50.0)
Transferrin: 293 mg/dL (ref 212.0–360.0)

## 2020-07-25 LAB — HEPATIC FUNCTION PANEL
ALT: 15 U/L (ref 0–35)
AST: 18 U/L (ref 0–37)
Albumin: 4.1 g/dL (ref 3.5–5.2)
Alkaline Phosphatase: 54 U/L (ref 39–117)
Bilirubin, Direct: 0 mg/dL (ref 0.0–0.3)
Total Bilirubin: 0.4 mg/dL (ref 0.2–1.2)
Total Protein: 7.4 g/dL (ref 6.0–8.3)

## 2020-07-25 LAB — CBC WITH DIFFERENTIAL/PLATELET
Basophils Absolute: 0.1 10*3/uL (ref 0.0–0.1)
Basophils Relative: 1.3 % (ref 0.0–3.0)
Eosinophils Absolute: 0.5 10*3/uL (ref 0.0–0.7)
Eosinophils Relative: 9.9 % — ABNORMAL HIGH (ref 0.0–5.0)
HCT: 34.5 % — ABNORMAL LOW (ref 36.0–46.0)
Hemoglobin: 10.9 g/dL — ABNORMAL LOW (ref 12.0–15.0)
Lymphocytes Relative: 30.2 % (ref 12.0–46.0)
Lymphs Abs: 1.4 10*3/uL (ref 0.7–4.0)
MCHC: 31.4 g/dL (ref 30.0–36.0)
MCV: 66.7 fl — ABNORMAL LOW (ref 78.0–100.0)
Monocytes Absolute: 0.3 10*3/uL (ref 0.1–1.0)
Monocytes Relative: 7 % (ref 3.0–12.0)
Neutro Abs: 2.5 10*3/uL (ref 1.4–7.7)
Neutrophils Relative %: 51.6 % (ref 43.0–77.0)
Platelets: 330 10*3/uL (ref 150.0–400.0)
RBC: 5.17 Mil/uL — ABNORMAL HIGH (ref 3.87–5.11)
RDW: 16.6 % — ABNORMAL HIGH (ref 11.5–15.5)
WBC: 4.8 10*3/uL (ref 4.0–10.5)

## 2020-07-25 LAB — TSH: TSH: 1.26 u[IU]/mL (ref 0.35–4.50)

## 2020-07-25 LAB — VITAMIN D 25 HYDROXY (VIT D DEFICIENCY, FRACTURES): VITD: 36.76 ng/mL (ref 30.00–100.00)

## 2020-07-25 LAB — VITAMIN B12: Vitamin B-12: 1044 pg/mL — ABNORMAL HIGH (ref 211–911)

## 2020-07-25 LAB — FERRITIN: Ferritin: 34 ng/mL (ref 10.0–291.0)

## 2020-07-26 DIAGNOSIS — Z01419 Encounter for gynecological examination (general) (routine) without abnormal findings: Secondary | ICD-10-CM | POA: Diagnosis not present

## 2020-07-26 DIAGNOSIS — Z124 Encounter for screening for malignant neoplasm of cervix: Secondary | ICD-10-CM | POA: Diagnosis not present

## 2020-07-26 DIAGNOSIS — Z6828 Body mass index (BMI) 28.0-28.9, adult: Secondary | ICD-10-CM | POA: Diagnosis not present

## 2020-07-26 DIAGNOSIS — Z1231 Encounter for screening mammogram for malignant neoplasm of breast: Secondary | ICD-10-CM | POA: Diagnosis not present

## 2020-07-26 LAB — HM MAMMOGRAPHY

## 2020-07-30 ENCOUNTER — Ambulatory Visit (INDEPENDENT_AMBULATORY_CARE_PROVIDER_SITE_OTHER): Payer: 59 | Admitting: Internal Medicine

## 2020-07-30 ENCOUNTER — Encounter: Payer: Self-pay | Admitting: Internal Medicine

## 2020-07-30 ENCOUNTER — Other Ambulatory Visit: Payer: Self-pay

## 2020-07-30 VITALS — BP 158/98 | HR 88 | Temp 98.6°F | Ht 71.0 in | Wt 200.0 lb

## 2020-07-30 DIAGNOSIS — N6019 Diffuse cystic mastopathy of unspecified breast: Secondary | ICD-10-CM | POA: Insufficient documentation

## 2020-07-30 DIAGNOSIS — Z0001 Encounter for general adult medical examination with abnormal findings: Secondary | ICD-10-CM

## 2020-07-30 DIAGNOSIS — N7011 Chronic salpingitis: Secondary | ICD-10-CM | POA: Insufficient documentation

## 2020-07-30 DIAGNOSIS — D509 Iron deficiency anemia, unspecified: Secondary | ICD-10-CM | POA: Diagnosis not present

## 2020-07-30 DIAGNOSIS — Z1211 Encounter for screening for malignant neoplasm of colon: Secondary | ICD-10-CM | POA: Diagnosis not present

## 2020-07-30 DIAGNOSIS — M1712 Unilateral primary osteoarthritis, left knee: Secondary | ICD-10-CM | POA: Diagnosis not present

## 2020-07-30 DIAGNOSIS — E559 Vitamin D deficiency, unspecified: Secondary | ICD-10-CM | POA: Diagnosis not present

## 2020-07-30 DIAGNOSIS — E7800 Pure hypercholesterolemia, unspecified: Secondary | ICD-10-CM | POA: Diagnosis not present

## 2020-07-30 DIAGNOSIS — Z0000 Encounter for general adult medical examination without abnormal findings: Secondary | ICD-10-CM

## 2020-07-30 DIAGNOSIS — F419 Anxiety disorder, unspecified: Secondary | ICD-10-CM | POA: Diagnosis not present

## 2020-07-30 NOTE — Progress Notes (Signed)
Patient ID: Ruth Medina, female   DOB: 05/16/1966, 55 y.o.   MRN: [id number]         Chief Complaint:: wellness exam and htn, low mcv, hld, left knee pain       HPI:  Ruth Medina is a 55 y.o. female here for wellness exam; due for cologuard, o/w up to date with preventive referrals and immunizations                     Also states BP elevated today, but BP at home < 140;90 consistently.  No overt bleeding recent.  Has chronic high LDL cholesterol despite lower chol diet, does not want statin.  Has chronic intermittent left knee pain followed per sport med, mild to mod, intermittent, sharp, worse to walk, better to rest, also better recently with accupuncture which she now does about every month.  Pt denies chest pain, increased sob or doe, wheezing, orthopnea, PND, increased LE swelling, palpitations, dizziness or syncope.  Denies worsening focal neuro s/s.   Pt denies polydipsia, polyuria,  Pt denies fever, wt loss, night sweats, loss of appetite, or other constitutional symptoms  Denies worsening depressive symptoms, suicidal ideation, or panic; has ongoing anxiety  Wt Readings from Last 3 Encounters:  07/30/20 200 lb (90.7 kg)  02/07/20 201 lb (91.2 kg)  06/07/19 199 lb (90.3 kg)   BP Readings from Last 3 Encounters:  07/30/20 (!) 158/98  02/07/20 (!) 148/90  06/07/19 132/90   Immunization History  Administered Date(s) Administered  . Influenza,inj,Quad PF,6+ Mos 03/02/2019  . Influenza-Unspecified 03/02/2016, 03/07/2018, 03/01/2020  . PFIZER(Purple Top)SARS-COV-2 Vaccination 06/02/2019, 06/16/2019, 07/07/2019, 01/31/2020  . Td 06/01/2000  . Tdap 01/30/2015   Health Maintenance Due  Topic Date Due  . Fecal DNA (Cologuard)  06/28/2020      Past Medical History:  Diagnosis Date  . 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 FRACTURE, HAND, LEFT 11/29/2007  . FRACTURE, PELVIS 11/29/2007  . HYPERLIPIDEMIA 01/06/2010  . SINUSITIS- ACUTE-NOS 10/12/2008   Past Surgical History:   Procedure Laterality Date  . aspiration of left knee    . Left hand surgery     Dr. Eulah Pont    reports that she has never smoked. She has never used smokeless tobacco. She reports that she does not drink alcohol and does not use drugs. family history includes Alcohol abuse in her father; Breast cancer in her mother; Cancer in her mother; Hypertension in her brother, brother, father, and mother. Allergies  Allergen Reactions  . Eggs Or Egg-Derived Products Nausea And Vomiting  . Glucosamine Forte [Nutritional Supplements] Rash  . Influenza Vaccines Rash  . Shellfish Allergy Hives and Rash   Current Outpatient Medications on File Prior to Visit  Medication Sig Dispense Refill  . CLOBETASOL PROPIONATE E 0.05 % emollient cream APPLY TO THE AFFECTED AREA(S) TWICE DAILY AS NEEDED 30 g 0  . diclofenac (VOLTAREN) 75 MG EC tablet 1 tab twice per day as needed 180 tablet 3  . Diclofenac Sodium 2 % SOLN Apply 1 pump twice daily. 112 g 3  . Diclofenac Sodium 2 % SOLN Place 2 g onto the skin 2 (two) times daily. 112 g 3  . fish oil-omega-3 fatty acids 1000 MG capsule Take 1 g by mouth daily.    . Multiple Vitamin (MULTIVITAMIN) tablet Take 1 tablet by mouth daily.    . ferrous sulfate 325 (65 FE) MG tablet Take 325 mg by mouth daily with breakfast.    . hydrocortisone (  ANUSOL-HC) 2.5 % rectal cream Anusol-HC 2.5 % rectal cream with applicator  Insert 1 application twice a day by rectal route as directed for 7 days.    Marland Kitchen nystatin-triamcinolone ointment (MYCOLOG) nystatin-triamcinolone 100,000 unit/gram-0.1 % topical ointment  APPLY TO THE AFFECTED AREA(S) 2 TIMES PER DAY FOR 7-14 DAYS    . PFIZER-BIONTECH COVID-19 VACC 30 MCG/0.3ML injection     . predniSONE (DELTASONE) 50 MG tablet Take 1 tablet (50 mg total) by mouth daily. 5 tablet 0   No current facility-administered medications on file prior to visit.        ROS:  All others reviewed and negative.  Objective        PE:  BP (!) 158/98    Pulse 88   Temp 98.6 F (37 C) (Oral)   Ht 5\' 11"  (1.803 m)   Wt 200 lb (90.7 kg)   SpO2 99%   BMI 27.89 kg/m                 Constitutional: Pt appears in NAD               HENT: Head: NCAT.                Right Ear: External ear normal.                 Left Ear: External ear normal.                Eyes: . Pupils are equal, round, and reactive to light. Conjunctivae and EOM are normal               Nose: without d/c or deformity               Neck: Neck supple. Gross normal ROM               Cardiovascular: Normal rate and regular rhythm.                 Pulmonary/Chest: Effort normal and breath sounds without rales or wheezing.                Abd:  Soft, NT, ND, + BS, no organomegaly               Neurological: Pt is alert. At baseline orientation, motor grossly intact               Skin: Skin is warm. No rashes, no other new lesions, LE edema - none               Psychiatric: Pt behavior is normal without agitation   Micro: none  Cardiac tracings I have personally interpreted today:  none  Pertinent Radiological findings (summarize): none   Lab Results  Component Value Date   WBC 4.8 07/25/2020   HGB 10.9 (L) 07/25/2020   HCT 34.5 (L) 07/25/2020   PLT 330.0 07/25/2020   GLUCOSE 97 07/25/2020   CHOL 252 (H) 07/25/2020   TRIG 138.0 07/25/2020   HDL 55.20 07/25/2020   LDLDIRECT 181.9 01/06/2010   LDLCALC 170 (H) 07/25/2020   ALT 15 07/25/2020   AST 18 07/25/2020   NA 138 07/25/2020   K 4.2 07/25/2020   CL 104 07/25/2020   CREATININE 0.99 07/25/2020   BUN 16 07/25/2020   CO2 24 07/25/2020   TSH 1.26 07/25/2020   Assessment/Plan:  Ruth Medina is a 55 y.o. Black or African American [2] female with  has a past  medical history of ANEMIA-IRON DEFICIENCY (11/29/2007), FRACTURE, HAND, LEFT (11/29/2007), FRACTURE, PELVIS (11/29/2007), HYPERLIPIDEMIA (01/06/2010), and SINUSITIS- ACUTE-NOS (10/12/2008).  Encounter for well adult exam with abnormal findings Age and sex  appropriate education and counseling updated with regular exercise and diet Referrals for preventative services - for referral for cologuard 'Immunizations addressed - none needed Smoking counseling  - none needed Evidence for depression or other mood disorder - none significant Most recent labs reviewed. I have personally reviewed and have noted: 1) the patient's medical and social history 2) The patient's current medications and supplements 3) The patient's height, weight, and BMI have been recorded in the chart   ANEMIA-IRON DEFICIENCY With recent iron normal and low mcv  ? 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  Oliver Barre, MD 08/08/2020 2:40 PM [facility]

## 2020-07-30 NOTE — Patient Instructions (Signed)
You will be signed up for the cologaurd  Please continue all other medications as before, and refills have been done if requested.  Please have the pharmacy call with any other refills you may need.  Please continue your efforts at being more active, low cholesterol diet, and weight control.  You are otherwise up to date with prevention measures today.  Please keep your appointments with your specialists as you may have planned  Please make an Appointment to return for your 1 year visit, or sooner if needed, with Lab testing by Appointment as well, to be done about 3-5 days before at the FIRST FLOOR Lab (so this is for TWO appointments - please see the scheduling desk as you leave)  Due to the ongoing Covid 19 pandemic, our lab now requires an appointment for any labs done at our office.  If you need labs done and do not have an appointment, please call our office ahead of time to schedule before presenting to the lab for your testing.

## 2020-08-08 ENCOUNTER — Encounter: Payer: Self-pay | Admitting: Internal Medicine

## 2020-08-08 NOTE — Assessment & Plan Note (Signed)
Stable overall, declines need for change of tx or referral for counseling at this time

## 2020-08-08 NOTE — Assessment & Plan Note (Signed)
With recent iron normal and low mcv  ? 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

## 2020-08-08 NOTE — Assessment & Plan Note (Signed)
Age and sex appropriate education and counseling updated with regular exercise and diet Referrals for preventative services - for referral for cologuard 'Immunizations addressed - none needed Smoking counseling  - none needed Evidence for depression or other mood disorder - none significant Most recent labs reviewed. I have personally reviewed and have noted: 1) the patient's medical and social history 2) The patient's current medications and supplements 3) The patient's height, weight, and BMI have been recorded in the chart

## 2020-08-08 NOTE — Assessment & Plan Note (Signed)
Lab Results  Component Value Date   LDLCALC 170 (H) 07/25/2020   Stable, pt to continue low chol diet - declines statin, may have genetic component

## 2020-08-08 NOTE — Assessment & Plan Note (Signed)
With ongoing pain, declines nsaid prn, to continue accupuncture,  to f/u any worsening symptoms or concerns with sport med for possible cortisone

## 2020-08-13 ENCOUNTER — Other Ambulatory Visit: Payer: Self-pay

## 2020-08-13 DIAGNOSIS — Z1211 Encounter for screening for malignant neoplasm of colon: Secondary | ICD-10-CM

## 2020-09-12 ENCOUNTER — Other Ambulatory Visit (HOSPITAL_COMMUNITY): Payer: Self-pay

## 2020-09-13 ENCOUNTER — Other Ambulatory Visit (HOSPITAL_COMMUNITY): Payer: Self-pay

## 2020-09-13 ENCOUNTER — Encounter: Payer: Self-pay | Admitting: Internal Medicine

## 2020-09-16 ENCOUNTER — Other Ambulatory Visit: Payer: Self-pay | Admitting: Internal Medicine

## 2020-09-16 ENCOUNTER — Other Ambulatory Visit (HOSPITAL_COMMUNITY): Payer: Self-pay

## 2020-09-16 MED ORDER — DICLOFENAC SODIUM 75 MG PO TBEC
DELAYED_RELEASE_TABLET | ORAL | 3 refills | Status: DC
  Filled 2020-09-16: qty 180, 90d supply, fill #0
  Filled 2020-12-26: qty 180, 90d supply, fill #1
  Filled 2021-04-14: qty 180, 90d supply, fill #2

## 2020-09-18 LAB — COLOGUARD

## 2020-09-26 ENCOUNTER — Telehealth: Payer: Self-pay

## 2020-09-26 NOTE — Telephone Encounter (Signed)
As per [facility], "the sample could not be processed; the sample stability limit had been exceeded.  The patient will be contacted to initiate a new sample."

## 2020-10-08 ENCOUNTER — Other Ambulatory Visit (HOSPITAL_COMMUNITY): Payer: Self-pay

## 2020-11-07 LAB — COLOGUARD

## 2020-11-07 NOTE — Telephone Encounter (Signed)
As per [employer] on 11/07/20, "the sample could not be processed;  Comment: An empty collection kit was received in the laboratory. The patient will be contacted to initiate a new sample collection."  LVM for pt to return call. Need to determine if pt has questions on how to collect specimen for cologuard.

## 2020-12-26 ENCOUNTER — Other Ambulatory Visit (HOSPITAL_COMMUNITY): Payer: Self-pay

## 2021-04-14 ENCOUNTER — Other Ambulatory Visit (HOSPITAL_COMMUNITY): Payer: Self-pay

## 2021-07-31 ENCOUNTER — Encounter: Payer: Self-pay | Admitting: Internal Medicine

## 2021-07-31 DIAGNOSIS — Z1231 Encounter for screening mammogram for malignant neoplasm of breast: Secondary | ICD-10-CM | POA: Diagnosis not present

## 2021-07-31 DIAGNOSIS — Z01419 Encounter for gynecological examination (general) (routine) without abnormal findings: Secondary | ICD-10-CM | POA: Diagnosis not present

## 2021-07-31 DIAGNOSIS — Z6827 Body mass index (BMI) 27.0-27.9, adult: Secondary | ICD-10-CM | POA: Diagnosis not present

## 2021-08-01 ENCOUNTER — Other Ambulatory Visit (INDEPENDENT_AMBULATORY_CARE_PROVIDER_SITE_OTHER): Payer: 59

## 2021-08-01 DIAGNOSIS — E559 Vitamin D deficiency, unspecified: Secondary | ICD-10-CM

## 2021-08-01 DIAGNOSIS — Z0000 Encounter for general adult medical examination without abnormal findings: Secondary | ICD-10-CM

## 2021-08-01 LAB — HEPATIC FUNCTION PANEL
ALT: 23 U/L (ref 0–35)
AST: 24 U/L (ref 0–37)
Albumin: 4.4 g/dL (ref 3.5–5.2)
Alkaline Phosphatase: 58 U/L (ref 39–117)
Bilirubin, Direct: 0 mg/dL (ref 0.0–0.3)
Total Bilirubin: 0.5 mg/dL (ref 0.2–1.2)
Total Protein: 7.5 g/dL (ref 6.0–8.3)

## 2021-08-01 LAB — URINALYSIS, ROUTINE W REFLEX MICROSCOPIC
Bilirubin Urine: NEGATIVE
Hgb urine dipstick: NEGATIVE
Ketones, ur: NEGATIVE
Leukocytes,Ua: NEGATIVE
Nitrite: NEGATIVE
Specific Gravity, Urine: 1.02 (ref 1.000–1.030)
Total Protein, Urine: NEGATIVE
Urine Glucose: NEGATIVE
Urobilinogen, UA: 0.2 (ref 0.0–1.0)
pH: 5.5 (ref 5.0–8.0)

## 2021-08-01 LAB — CBC WITH DIFFERENTIAL/PLATELET
Basophils Absolute: 0.1 10*3/uL (ref 0.0–0.1)
Basophils Relative: 1.2 % (ref 0.0–3.0)
Eosinophils Absolute: 0.4 10*3/uL (ref 0.0–0.7)
Eosinophils Relative: 8.4 % — ABNORMAL HIGH (ref 0.0–5.0)
HCT: 35.3 % — ABNORMAL LOW (ref 36.0–46.0)
Hemoglobin: 11.1 g/dL — ABNORMAL LOW (ref 12.0–15.0)
Lymphocytes Relative: 29.1 % (ref 12.0–46.0)
Lymphs Abs: 1.5 10*3/uL (ref 0.7–4.0)
MCHC: 31.4 g/dL (ref 30.0–36.0)
MCV: 66.4 fl — ABNORMAL LOW (ref 78.0–100.0)
Monocytes Absolute: 0.4 10*3/uL (ref 0.1–1.0)
Monocytes Relative: 7.3 % (ref 3.0–12.0)
Neutro Abs: 2.8 10*3/uL (ref 1.4–7.7)
Neutrophils Relative %: 54 % (ref 43.0–77.0)
Platelets: 354 10*3/uL (ref 150.0–400.0)
RBC: 5.32 Mil/uL — ABNORMAL HIGH (ref 3.87–5.11)
RDW: 16.5 % — ABNORMAL HIGH (ref 11.5–15.5)
WBC: 5.3 10*3/uL (ref 4.0–10.5)

## 2021-08-01 LAB — BASIC METABOLIC PANEL
BUN: 16 mg/dL (ref 6–23)
CO2: 26 mEq/L (ref 19–32)
Calcium: 9.8 mg/dL (ref 8.4–10.5)
Chloride: 103 mEq/L (ref 96–112)
Creatinine, Ser: 1.05 mg/dL (ref 0.40–1.20)
GFR: 59.01 mL/min — ABNORMAL LOW (ref >60.00)
Glucose, Bld: 93 mg/dL (ref 70–99)
Potassium: 4.1 mEq/L (ref 3.5–5.1)
Sodium: 138 mEq/L (ref 135–145)

## 2021-08-01 LAB — LIPID PANEL
Cholesterol: 249 mg/dL — ABNORMAL HIGH (ref 0–200)
HDL: 57.1 mg/dL (ref >39.00)
LDL Cholesterol: 168 mg/dL — ABNORMAL HIGH (ref 0–99)
NonHDL: 191.5
Total CHOL/HDL Ratio: 4
Triglycerides: 118 mg/dL (ref 0.0–149.0)
VLDL: 23.6 mg/dL (ref 0.0–40.0)

## 2021-08-01 LAB — TSH: TSH: 0.96 u[IU]/mL (ref 0.35–5.50)

## 2021-08-01 LAB — VITAMIN D 25 HYDROXY (VIT D DEFICIENCY, FRACTURES): VITD: 30.63 ng/mL (ref 30.00–100.00)

## 2021-08-08 ENCOUNTER — Encounter: Payer: 59 | Admitting: Internal Medicine

## 2021-08-11 ENCOUNTER — Ambulatory Visit (INDEPENDENT_AMBULATORY_CARE_PROVIDER_SITE_OTHER): Payer: 59 | Admitting: Internal Medicine

## 2021-08-11 ENCOUNTER — Encounter: Payer: Self-pay | Admitting: Internal Medicine

## 2021-08-11 ENCOUNTER — Other Ambulatory Visit: Payer: Self-pay

## 2021-08-11 VITALS — BP 120/78 | HR 55 | Temp 99.0°F | Ht 71.0 in | Wt 196.0 lb

## 2021-08-11 DIAGNOSIS — Z1211 Encounter for screening for malignant neoplasm of colon: Secondary | ICD-10-CM | POA: Diagnosis not present

## 2021-08-11 DIAGNOSIS — Z0001 Encounter for general adult medical examination with abnormal findings: Secondary | ICD-10-CM | POA: Diagnosis not present

## 2021-08-11 DIAGNOSIS — D509 Iron deficiency anemia, unspecified: Secondary | ICD-10-CM | POA: Diagnosis not present

## 2021-08-11 DIAGNOSIS — E7800 Pure hypercholesterolemia, unspecified: Secondary | ICD-10-CM

## 2021-08-11 DIAGNOSIS — E559 Vitamin D deficiency, unspecified: Secondary | ICD-10-CM

## 2021-08-11 NOTE — Progress Notes (Signed)
Patient ID: Ruth Medina, female   DOB: 1965-10-20, 56 y.o.   MRN: [id number] ? ? ? ?     Chief Complaint:: wellness exam and low vit d, hld, iron def anemia ? ?     HPI:  Ruth Medina is a 56 y.o. female here for wellness exam; due for cologuard, declines shingrix, o/w up to date ?         ?              Also Not taking Vit D.  Not interested now in cardiac ct score for now or statin, wants to work on lower chol diet.  Pt denies chest pain, increased sob or doe, wheezing, orthopnea, PND, increased LE swelling, palpitations, dizziness or syncope.   Pt denies polydipsia, polyuria, or new focal neuro s/s.    Pt denies fever, wt loss, night sweats, loss of appetite, or other constitutional symptoms  No other new complaints ?  ?Wt Readings from Last 3 Encounters:  ?08/11/21 196 lb (88.9 kg)  ?07/30/20 200 lb (90.7 kg)  ?02/07/20 201 lb (91.2 kg)  ? ?BP Readings from Last 3 Encounters:  ?08/11/21 120/78  ?07/30/20 (!) 158/98  ?02/07/20 (!) 148/90  ? ?Immunization History  ?Administered Date(s) Administered  ? Influenza,inj,Quad PF,6+ Mos 03/02/2019, 03/23/2021  ? Influenza-Unspecified 03/02/2016, 03/07/2018, 03/01/2020  ? PFIZER(Purple Top)SARS-COV-2 Vaccination 06/02/2019, 06/16/2019, 07/07/2019, 01/31/2020, 02/19/2021  ? Td 06/01/2000  ? Tdap 01/30/2015  ? ?Health Maintenance Due  ?Topic Date Due  ? Fecal DNA (Cologuard)  06/28/2020  ? ?  ? ?Past Medical History:  ?Diagnosis Date  ? ANEMIA-IRON DEFICIENCY 11/29/2007  ? FRACTURE, HAND, LEFT 11/29/2007  ? FRACTURE, PELVIS 11/29/2007  ? HYPERLIPIDEMIA 01/06/2010  ? SINUSITIS- ACUTE-NOS 10/12/2008  ? ?Past Surgical History:  ?Procedure Laterality Date  ? aspiration of left knee    ? Left hand surgery    ? Dr. Eulah Pont  ? ? reports that she has never smoked. She has never used smokeless tobacco. She reports that she does not drink alcohol and does not use drugs. ?family history includes Alcohol abuse in her father; Breast cancer in her mother; Cancer in her mother;  Hypertension in her brother, brother, father, and mother. ?Allergies  ?Allergen Reactions  ? Eggs Or Egg-Derived Products Nausea And Vomiting  ? Glucosamine Forte [Nutritional Supplements] Rash  ? Influenza Vaccines Rash  ? Shellfish Allergy Hives and Rash  ? ?Current Outpatient Medications on File Prior to Visit  ?Medication Sig Dispense Refill  ? BABY ASPIRIN PO     ? diclofenac (VOLTAREN) 75 MG EC tablet take 1 tablet by mouth twice daily as needed 180 tablet 3  ? diphenhydrAMINE (BENADRYL) 25 mg capsule     ? Multiple Vitamin (MULTIVITAMIN) tablet Take 1 tablet by mouth daily.    ? nystatin-triamcinolone ointment (MYCOLOG) nystatin-triamcinolone 100,000 unit/gram-0.1 % topical ointment ? APPLY TO THE AFFECTED AREA(S) 2 TIMES PER DAY FOR 7-14 DAYS    ? Red Yeast Rice Extract 600 MG CAPS     ? Turmeric (QC TUMERIC COMPLEX PO)     ? ?No current facility-administered medications on file prior to visit.  ? ?     ROS:  All others reviewed and negative. ? ?Objective  ? ?     PE:  BP 120/78 (BP Location: Right Arm, Patient Position: Sitting, Cuff Size: Large)   Pulse (!) 55   Temp 99 ?F (37.2 ?C) (Oral)   Ht 5\' 11"  (1.803 m)   Wt  196 lb (88.9 kg)   SpO2 100%   BMI 27.34 kg/m?  ? ?              Constitutional: Pt appears in NAD ?              HENT: Head: NCAT.  ?              Right Ear: External ear normal.   ?              Left Ear: External ear normal.  ?              Eyes: . Pupils are equal, round, and reactive to light. Conjunctivae and EOM are normal ?              Nose: without d/c or deformity ?              Neck: Neck supple. Gross normal ROM ?              Cardiovascular: Normal rate and regular rhythm.   ?              Pulmonary/Chest: Effort normal and breath sounds without rales or wheezing.  ?              Abd:  Soft, NT, ND, + BS, no organomegaly ?              Neurological: Pt is alert. At baseline orientation, motor grossly intact ?              Skin: Skin is warm. No rashes, no other new lesions,  LE edema - none ?              Psychiatric: Pt behavior is normal without agitation  ? ?Micro: none ? ?Cardiac tracings I have personally interpreted today:  none ? ?Pertinent Radiological findings (summarize): none  ? ?Lab Results  ?Component Value Date  ? WBC 5.3 08/01/2021  ? HGB 11.1 (L) 08/01/2021  ? HCT 35.3 (L) 08/01/2021  ? PLT 354.0 08/01/2021  ? GLUCOSE 93 08/01/2021  ? CHOL 249 (H) 08/01/2021  ? TRIG 118.0 08/01/2021  ? HDL 57.10 08/01/2021  ? LDLDIRECT 181.9 01/06/2010  ? LDLCALC 168 (H) 08/01/2021  ? ALT 23 08/01/2021  ? AST 24 08/01/2021  ? NA 138 08/01/2021  ? K 4.1 08/01/2021  ? CL 103 08/01/2021  ? CREATININE 1.05 08/01/2021  ? BUN 16 08/01/2021  ? CO2 26 08/01/2021  ? TSH 0.96 08/01/2021  ? ?Assessment/Plan:  ?Ruth Medina is a 56 y.o. Black or African American [2] female with  has a past medical history of ANEMIA-IRON DEFICIENCY (11/29/2007), FRACTURE, HAND, LEFT (11/29/2007), FRACTURE, PELVIS (11/29/2007), HYPERLIPIDEMIA (01/06/2010), and SINUSITIS- ACUTE-NOS (10/12/2008). ? ?Encounter for well adult exam with abnormal findings ?Age and sex appropriate education and counseling updated with regular exercise and diet ?Referrals for preventative services - for cologuard ?Immunizations addressed - declines shingrix ?Smoking counseling  - none needed ?Evidence for depression or other mood disorder - none significant ?Most recent labs reviewed. ?I have personally reviewed and have noted: ?1) the patient's medical and social history ?2) The patient's current medications and supplements ?3) The patient's height, weight, and BMI have been recorded in the chart ? ? ?Vitamin D deficiency ?Last vitamin D ?Lab Results  ?Component Value Date  ? VD25OH 30.63 08/01/2021  ? ?Low, to start oral replacement ? ? ?Hyperlipidemia ?Lab Results  ?Component Value Date  ? LDLCALC 168 (  H) 08/01/2021  ? ?Uncontrolled, goal ldl < 100, d/w pt - for lower chol diet, declines statin or cardiac CT score ? ? ?ANEMIA-IRON  DEFICIENCY ?No recent overt bleeding, cont MVI with iron ? ?Followup: Return in about 1 year (around 08/12/2022). ? ?Oliver Barre, MD 08/11/2021 5:42 PM ?[facility] ?[facility] ?Internal Medicine ?

## 2021-08-11 NOTE — Assessment & Plan Note (Signed)
Age and sex appropriate education and counseling updated with regular exercise and diet Referrals for preventative services - for cologuard Immunizations addressed - declines shingrix Smoking counseling  - none needed Evidence for depression or other mood disorder - none significant Most recent labs reviewed. I have personally reviewed and have noted: 1) the patient's medical and social history 2) The patient's current medications and supplements 3) The patient's height, weight, and BMI have been recorded in the chart  

## 2021-08-11 NOTE — Patient Instructions (Signed)
Please take OTC Vitamin D3 at 2000 units per day, indefinitely ? ?Please continue all other medications as before, and refills have been done if requested. ? ?Please have the pharmacy call with any other refills you may need. ? ?Please continue your efforts at being more active, low cholesterol diet, and weight control. ? ?You are otherwise up to date with prevention measures today. ? ?Please keep your appointments with your specialists as you may have planned ? ?Please call if you change your mind about the Cardiac CT score or a trial of a statin such as lipitor or crestor ? ?Please make an Appointment to return for your 1 year visit, or sooner if needed, with Lab testing by Appointment as well, to be done about 3-5 days before at the FIRST FLOOR Lab (so this is for TWO appointments - please see the scheduling desk as you leave) ? ? ?Due to the ongoing Covid 19 pandemic, our lab now requires an appointment for any labs done at our office.  If you need labs done and do not have an appointment, please call our office ahead of time to schedule before presenting to the lab for your testing. ? ?

## 2021-08-11 NOTE — Assessment & Plan Note (Signed)
Lab Results  ?Component Value Date  ? LDLCALC 168 (H) 08/01/2021  ? ?Uncontrolled, goal ldl < 100, d/w pt - for lower chol diet, declines statin or cardiac CT score ? ?

## 2021-08-11 NOTE — Assessment & Plan Note (Signed)
No recent overt bleeding, cont MVI with iron ?

## 2021-08-11 NOTE — Assessment & Plan Note (Signed)
Last vitamin D ?Lab Results  ?Component Value Date  ? VD25OH 30.63 08/01/2021  ? ?Low, to start oral replacement ? ?

## 2021-08-18 DIAGNOSIS — Z1211 Encounter for screening for malignant neoplasm of colon: Secondary | ICD-10-CM | POA: Diagnosis not present

## 2021-08-26 LAB — COLOGUARD: COLOGUARD: NEGATIVE

## 2021-10-06 DIAGNOSIS — H524 Presbyopia: Secondary | ICD-10-CM | POA: Diagnosis not present

## 2021-10-29 ENCOUNTER — Other Ambulatory Visit (HOSPITAL_COMMUNITY): Payer: Self-pay

## 2021-10-29 ENCOUNTER — Other Ambulatory Visit: Payer: Self-pay | Admitting: Internal Medicine

## 2021-10-29 MED ORDER — DICLOFENAC SODIUM 75 MG PO TBEC
DELAYED_RELEASE_TABLET | ORAL | 3 refills | Status: DC
Start: 2021-10-29 — End: 2022-12-21
  Filled 2021-10-29: qty 180, 90d supply, fill #0
  Filled 2022-01-23: qty 180, 90d supply, fill #1
  Filled 2022-04-10: qty 180, 90d supply, fill #2
  Filled 2022-07-22: qty 180, 90d supply, fill #3

## 2021-10-30 ENCOUNTER — Other Ambulatory Visit (HOSPITAL_COMMUNITY): Payer: Self-pay

## 2022-01-08 ENCOUNTER — Other Ambulatory Visit (HOSPITAL_COMMUNITY): Payer: Self-pay

## 2022-01-08 MED ORDER — AMOXICILLIN-POT CLAVULANATE 875-125 MG PO TABS
ORAL_TABLET | ORAL | 0 refills | Status: DC
Start: 2022-01-08 — End: 2022-08-17
  Filled 2022-01-08: qty 14, 7d supply, fill #0

## 2022-01-20 ENCOUNTER — Encounter: Payer: Self-pay | Admitting: Internal Medicine

## 2022-01-22 ENCOUNTER — Other Ambulatory Visit (HOSPITAL_COMMUNITY): Payer: Self-pay

## 2022-01-23 ENCOUNTER — Other Ambulatory Visit (HOSPITAL_COMMUNITY): Payer: Self-pay

## 2022-01-26 ENCOUNTER — Telehealth: Payer: Self-pay | Admitting: Internal Medicine

## 2022-01-26 MED ORDER — CLONAZEPAM 0.5 MG PO TABS
0.5000 mg | ORAL_TABLET | Freq: Two times a day (BID) | ORAL | 1 refills | Status: DC | PRN
  Filled 2022-01-26: qty 60, 30d supply, fill #0
  Filled 2022-04-10: qty 60, 30d supply, fill #1

## 2022-01-26 NOTE — Telephone Encounter (Signed)
Patient called back and would like to know about her clonazapam.

## 2022-01-27 ENCOUNTER — Other Ambulatory Visit (HOSPITAL_COMMUNITY): Payer: Self-pay

## 2022-04-10 ENCOUNTER — Other Ambulatory Visit (HOSPITAL_COMMUNITY): Payer: Self-pay

## 2022-07-22 ENCOUNTER — Other Ambulatory Visit (HOSPITAL_COMMUNITY): Payer: Self-pay

## 2022-07-22 ENCOUNTER — Other Ambulatory Visit: Payer: Self-pay | Admitting: Internal Medicine

## 2022-07-22 ENCOUNTER — Other Ambulatory Visit: Payer: Self-pay

## 2022-07-22 MED ORDER — CLONAZEPAM 0.5 MG PO TABS
0.5000 mg | ORAL_TABLET | Freq: Two times a day (BID) | ORAL | 0 refills | Status: DC | PRN
  Filled 2022-07-22: qty 60, 30d supply, fill #0

## 2022-08-16 ENCOUNTER — Encounter: Payer: Self-pay | Admitting: Internal Medicine

## 2022-08-17 ENCOUNTER — Encounter: Payer: Self-pay | Admitting: Family Medicine

## 2022-08-17 ENCOUNTER — Ambulatory Visit: Payer: Commercial Managed Care - PPO | Admitting: Family Medicine

## 2022-08-17 VITALS — BP 160/92 | HR 80 | Temp 97.8°F | Resp 20 | Ht 71.0 in | Wt 205.0 lb

## 2022-08-17 DIAGNOSIS — S99922A Unspecified injury of left foot, initial encounter: Secondary | ICD-10-CM | POA: Diagnosis not present

## 2022-08-17 DIAGNOSIS — Z23 Encounter for immunization: Secondary | ICD-10-CM

## 2022-08-17 NOTE — Progress Notes (Signed)
Assessment & Plan:  1. Foot injury, left, initial encounter Encouraged to continue soaking the foot, applying antibiotic ointment, and covering with a Band-Aid.  Reassurance provided that there is no infection.  Advised to let me know if the foot develops redness, warmth, swelling, or drainage of pus.  Tetanus updated today since it has been over 5 years since her last. - Td vaccine greater than or equal to 59yo preservative free IM   Follow up plan: Return if symptoms worsen or fail to improve.  Hendricks Limes, MSN, APRN, FNP-C  Subjective:  HPI: Ruth Medina is a 57 y.o. female presenting on 08/17/2022 for No chief complaint on file.  Patient is here after stepping on a nail with her left foot yesterday.  Her last tetanus was a Tdap given in 2016. Denies any fever, swelling, warmth, or drainage. It did bleed when the nail went in, but she has been soaking her foot and applying antibiotic ointment, covered with a Band-Aid.   ROS: Negative unless specifically indicated above in HPI.   Relevant past medical history reviewed and updated as indicated.   Allergies and medications reviewed and updated.   Current Outpatient Medications:    BABY ASPIRIN PO, , Disp: , Rfl:    clonazePAM (KLONOPIN) 0.5 MG tablet, Take 1 tablet (0.5 mg total) by mouth 2 (two) times daily as needed for anxiety., Disp: 60 tablet, Rfl: 0   COD LIVER OIL PO, Take 1 tablet by mouth daily., Disp: , Rfl:    diclofenac (VOLTAREN) 75 MG EC tablet, Take 1 tablet by mouth twice daily as needed, Disp: 180 tablet, Rfl: 3   diphenhydrAMINE (BENADRYL) 25 mg capsule, , Disp: , Rfl:    Multiple Vitamin (MULTIVITAMIN) tablet, Take 1 tablet by mouth daily., Disp: , Rfl:    nystatin-triamcinolone ointment (MYCOLOG), nystatin-triamcinolone 100,000 unit/gram-0.1 % topical ointment  APPLY TO THE AFFECTED AREA(S) 2 TIMES PER DAY FOR 7-14 DAYS, Disp: , Rfl:    Red Yeast Rice Extract 600 MG CAPS, , Disp: , Rfl:    Turmeric (QC  TUMERIC COMPLEX PO), , Disp: , Rfl:    vitamin E 180 MG (400 UNITS) capsule, Take 400 Units by mouth daily., Disp: , Rfl:   Allergies  Allergen Reactions   Egg-Derived Products Nausea And Vomiting   Glucosamine Forte [Nutritional Supplements] Rash   Influenza Vaccines Rash   Shellfish Allergy Hives and Rash    Objective:   BP (!) 160/92   Pulse 80   Temp 97.8 F (36.6 C)   Resp 20   Ht 5\' 11"  (1.803 m)   Wt 205 lb (93 kg)   LMP  (LMP Unknown)   BMI 28.59 kg/m    Physical Exam Vitals reviewed.  Constitutional:      General: She is not in acute distress.    Appearance: Normal appearance. She is not ill-appearing, toxic-appearing or diaphoretic.  HENT:     Head: Normocephalic and atraumatic.  Eyes:     General: No scleral icterus.       Right eye: No discharge.        Left eye: No discharge.     Conjunctiva/sclera: Conjunctivae normal.  Cardiovascular:     Rate and Rhythm: Normal rate.  Pulmonary:     Effort: Pulmonary effort is normal. No respiratory distress.  Musculoskeletal:        General: Normal range of motion.     Cervical back: Normal range of motion.  Skin:    General:  Skin is warm and dry.     Capillary Refill: Capillary refill takes less than 2 seconds.     Comments: Small puncture wound at the back of the left heel. No erythema, swelling, warmth, or drainage.  Neurological:     General: No focal deficit present.     Mental Status: She is alert and oriented to person, place, and time. Mental status is at baseline.  Psychiatric:        Mood and Affect: Mood normal.        Behavior: Behavior normal.        Thought Content: Thought content normal.        Judgment: Judgment normal.

## 2022-08-31 ENCOUNTER — Encounter: Payer: Self-pay | Admitting: Internal Medicine

## 2022-08-31 DIAGNOSIS — E7800 Pure hypercholesterolemia, unspecified: Secondary | ICD-10-CM

## 2022-08-31 DIAGNOSIS — E559 Vitamin D deficiency, unspecified: Secondary | ICD-10-CM

## 2022-08-31 DIAGNOSIS — R739 Hyperglycemia, unspecified: Secondary | ICD-10-CM

## 2022-08-31 DIAGNOSIS — E538 Deficiency of other specified B group vitamins: Secondary | ICD-10-CM

## 2022-09-03 ENCOUNTER — Other Ambulatory Visit (INDEPENDENT_AMBULATORY_CARE_PROVIDER_SITE_OTHER): Payer: Commercial Managed Care - PPO

## 2022-09-03 DIAGNOSIS — E538 Deficiency of other specified B group vitamins: Secondary | ICD-10-CM

## 2022-09-03 DIAGNOSIS — E559 Vitamin D deficiency, unspecified: Secondary | ICD-10-CM | POA: Diagnosis not present

## 2022-09-03 DIAGNOSIS — E7800 Pure hypercholesterolemia, unspecified: Secondary | ICD-10-CM

## 2022-09-03 DIAGNOSIS — R739 Hyperglycemia, unspecified: Secondary | ICD-10-CM | POA: Diagnosis not present

## 2022-09-03 LAB — LIPID PANEL
Cholesterol: 298 mg/dL — ABNORMAL HIGH (ref 0–200)
HDL: 59.1 mg/dL (ref >39.00)
LDL Cholesterol: 219 mg/dL — ABNORMAL HIGH (ref 0–99)
NonHDL: 239.2
Total CHOL/HDL Ratio: 5
Triglycerides: 99 mg/dL (ref 0.0–149.0)
VLDL: 19.8 mg/dL (ref 0.0–40.0)

## 2022-09-03 LAB — URINALYSIS, ROUTINE W REFLEX MICROSCOPIC
Bilirubin Urine: NEGATIVE
Hgb urine dipstick: NEGATIVE
Ketones, ur: NEGATIVE
Leukocytes,Ua: NEGATIVE
Nitrite: NEGATIVE
Specific Gravity, Urine: 1.025 (ref 1.000–1.030)
Total Protein, Urine: NEGATIVE
Urine Glucose: NEGATIVE
Urobilinogen, UA: 0.2 (ref 0.0–1.0)
pH: 6 (ref 5.0–8.0)

## 2022-09-03 LAB — HEPATIC FUNCTION PANEL
ALT: 16 U/L (ref 0–35)
AST: 20 U/L (ref 0–37)
Albumin: 4.3 g/dL (ref 3.5–5.2)
Alkaline Phosphatase: 59 U/L (ref 39–117)
Bilirubin, Direct: 0.1 mg/dL (ref 0.0–0.3)
Total Bilirubin: 0.4 mg/dL (ref 0.2–1.2)
Total Protein: 7.3 g/dL (ref 6.0–8.3)

## 2022-09-03 LAB — CBC WITH DIFFERENTIAL/PLATELET
Basophils Absolute: 0.1 10*3/uL (ref 0.0–0.1)
Basophils Relative: 1.3 % (ref 0.0–3.0)
Eosinophils Absolute: 0.5 10*3/uL (ref 0.0–0.7)
Eosinophils Relative: 10.7 % — ABNORMAL HIGH (ref 0.0–5.0)
HCT: 35.1 % — ABNORMAL LOW (ref 36.0–46.0)
Hemoglobin: 11.4 g/dL — ABNORMAL LOW (ref 12.0–15.0)
Lymphocytes Relative: 31.2 % (ref 12.0–46.0)
Lymphs Abs: 1.4 10*3/uL (ref 0.7–4.0)
MCHC: 32.4 g/dL (ref 30.0–36.0)
MCV: 67.5 fl — ABNORMAL LOW (ref 78.0–100.0)
Monocytes Absolute: 0.3 10*3/uL (ref 0.1–1.0)
Monocytes Relative: 7.6 % (ref 3.0–12.0)
Neutro Abs: 2.2 10*3/uL (ref 1.4–7.7)
Neutrophils Relative %: 49.2 % (ref 43.0–77.0)
Platelets: 342 10*3/uL (ref 150.0–400.0)
RBC: 5.2 Mil/uL — ABNORMAL HIGH (ref 3.87–5.11)
RDW: 16.6 % — ABNORMAL HIGH (ref 11.5–15.5)
WBC: 4.4 10*3/uL (ref 4.0–10.5)

## 2022-09-03 LAB — MICROALBUMIN / CREATININE URINE RATIO
Creatinine,U: 240.2 mg/dL
Microalb Creat Ratio: 0.3 mg/g (ref 0.0–30.0)
Microalb, Ur: 0.8 mg/dL (ref 0.0–1.9)

## 2022-09-03 LAB — BASIC METABOLIC PANEL
BUN: 12 mg/dL (ref 6–23)
CO2: 26 mEq/L (ref 19–32)
Calcium: 9.5 mg/dL (ref 8.4–10.5)
Chloride: 105 mEq/L (ref 96–112)
Creatinine, Ser: 0.99 mg/dL (ref 0.40–1.20)
GFR: 62.84 mL/min (ref >60.00)
Glucose, Bld: 96 mg/dL (ref 70–99)
Potassium: 4.2 mEq/L (ref 3.5–5.1)
Sodium: 138 mEq/L (ref 135–145)

## 2022-09-03 LAB — VITAMIN D 25 HYDROXY (VIT D DEFICIENCY, FRACTURES): VITD: 37.28 ng/mL (ref 30.00–100.00)

## 2022-09-03 LAB — VITAMIN B12: Vitamin B-12: 693 pg/mL (ref 211–911)

## 2022-09-03 LAB — TSH: TSH: 1.29 u[IU]/mL (ref 0.35–5.50)

## 2022-09-03 LAB — HEMOGLOBIN A1C: Hgb A1c MFr Bld: 6.3 % (ref 4.6–6.5)

## 2022-09-18 ENCOUNTER — Other Ambulatory Visit: Payer: Self-pay

## 2022-09-18 ENCOUNTER — Ambulatory Visit (INDEPENDENT_AMBULATORY_CARE_PROVIDER_SITE_OTHER): Payer: Commercial Managed Care - PPO | Admitting: Internal Medicine

## 2022-09-18 ENCOUNTER — Other Ambulatory Visit (HOSPITAL_COMMUNITY): Payer: Self-pay

## 2022-09-18 ENCOUNTER — Encounter: Payer: Self-pay | Admitting: Internal Medicine

## 2022-09-18 VITALS — BP 122/76 | HR 70 | Temp 98.6°F | Ht 71.0 in | Wt 203.0 lb

## 2022-09-18 DIAGNOSIS — E559 Vitamin D deficiency, unspecified: Secondary | ICD-10-CM

## 2022-09-18 DIAGNOSIS — R9431 Abnormal electrocardiogram [ECG] [EKG]: Secondary | ICD-10-CM

## 2022-09-18 DIAGNOSIS — E7800 Pure hypercholesterolemia, unspecified: Secondary | ICD-10-CM

## 2022-09-18 DIAGNOSIS — B372 Candidiasis of skin and nail: Secondary | ICD-10-CM | POA: Insufficient documentation

## 2022-09-18 DIAGNOSIS — Z0001 Encounter for general adult medical examination with abnormal findings: Secondary | ICD-10-CM

## 2022-09-18 DIAGNOSIS — R739 Hyperglycemia, unspecified: Secondary | ICD-10-CM

## 2022-09-18 DIAGNOSIS — D509 Iron deficiency anemia, unspecified: Secondary | ICD-10-CM | POA: Diagnosis not present

## 2022-09-18 DIAGNOSIS — N898 Other specified noninflammatory disorders of vagina: Secondary | ICD-10-CM | POA: Insufficient documentation

## 2022-09-18 DIAGNOSIS — B3731 Acute candidiasis of vulva and vagina: Secondary | ICD-10-CM | POA: Insufficient documentation

## 2022-09-18 MED ORDER — CLONAZEPAM 0.5 MG PO TABS
0.5000 mg | ORAL_TABLET | Freq: Two times a day (BID) | ORAL | 5 refills | Status: DC | PRN
  Filled 2022-09-18: qty 60, 30d supply, fill #0
  Filled 2022-12-21: qty 60, 30d supply, fill #1
  Filled 2023-03-16: qty 60, 30d supply, fill #2

## 2022-09-18 NOTE — Patient Instructions (Signed)
Please continue all other medications as before, and refills have been done if requested - the klonopin  Please have the pharmacy call with any other refills you may need.  Please continue your efforts at being more active, low cholesterol diet, and weight control.  You are otherwise up to date with prevention measures today.  Please keep your appointments with your specialists as you may have planned  You will be contacted regarding the referral for: CT Cardiac Score  Please make an Appointment to return for your 1 year visit, or sooner if needed, with Lab testing by Appointment as well, to be done about 3-5 days before at the FIRST FLOOR Lab (so this is for TWO appointments - please see the scheduling desk as you leave)

## 2022-09-18 NOTE — Progress Notes (Unsigned)
Patient ID: Ruth Medina, female   DOB: 1966/03/16, 57 y.o.   MRN: [id number]         Chief Complaint:: wellness exam and hld       HPI:  Ruth Medina is a 57 y.o. female here for wellness exam; sched for mammogram next wk, declines shingrix , o/w up to date                        Also trying to follow lower chol diet, states high LDL runs in the family.  Pt denies chest pain, increased sob or doe, wheezing, orthopnea, PND, increased LE swelling, palpitations, dizziness or syncope.   Pt denies polydipsia, polyuria, or new focal neuro s/s.    Pt denies fever, wt loss, night sweats, loss of appetite, or other constitutional symptoms  Willing for Card CT score testing.  No recent noticed bleeding or bruising.    Wt Readings from Last 3 Encounters:  09/18/22 203 lb (92.1 kg)  08/17/22 205 lb (93 kg)  08/11/21 196 lb (88.9 kg)   BP Readings from Last 3 Encounters:  09/18/22 122/76  08/17/22 (!) 160/92  08/11/21 120/78   Immunization History  Administered Date(s) Administered   COVID-19, mRNA, vaccine(Comirnaty)12 years and older 02/16/2022   Influenza,inj,Quad PF,6+ Mos 03/02/2019, 03/23/2021   Influenza-Unspecified 03/02/2016, 03/07/2018, 03/01/2020, 02/21/2022   PFIZER(Purple Top)SARS-COV-2 Vaccination 06/02/2019, 06/16/2019, 07/07/2019, 01/31/2020, 02/19/2021   Td 06/01/2000, 08/17/2022   Tdap 01/30/2015  There are no preventive care reminders to display for this patient.    Past Medical History:  Diagnosis Date   ANEMIA-IRON DEFICIENCY 11/29/2007   FRACTURE, HAND, LEFT 11/29/2007   FRACTURE, PELVIS 11/29/2007   HYPERLIPIDEMIA 01/06/2010   SINUSITIS- ACUTE-NOS 10/12/2008   Past Surgical History:  Procedure Laterality Date   aspiration of left knee     Left hand surgery     Dr. Eulah Pont    reports that she has never smoked. She has never used smokeless tobacco. She reports that she does not drink alcohol and does not use drugs. family history includes Alcohol abuse in her  father; Breast cancer in her mother; Cancer in her mother; Hypertension in her brother, brother, father, and mother. Allergies  Allergen Reactions   Glucosamine Forte [Nutritional Supplements] Rash   Current Outpatient Medications on File Prior to Visit  Medication Sig Dispense Refill   BABY ASPIRIN PO      diclofenac (VOLTAREN) 75 MG EC tablet Take 1 tablet by mouth twice daily as needed 180 tablet 3   Multiple Vitamin (MULTIVITAMIN) tablet Take 1 tablet by mouth daily.     No current facility-administered medications on file prior to visit.        ROS:  All others reviewed and negative.  Objective        PE:  BP 122/76 (BP Location: Right Arm, Patient Position: Sitting, Cuff Size: Normal)   Pulse 70   Temp 98.6 F (37 C) (Oral)   Ht 5\' 11"  (1.803 m)   Wt 203 lb (92.1 kg)   LMP  (LMP Unknown)   SpO2 99%   BMI 28.31 kg/m                 Constitutional: Pt appears in NAD               HENT: Head: NCAT.                Right Ear: External ear normal.  Left Ear: External ear normal.                Eyes: . Pupils are equal, round, and reactive to light. Conjunctivae and EOM are normal               Nose: without d/c or deformity               Neck: Neck supple. Gross normal ROM               Cardiovascular: Normal rate and regular rhythm.                 Pulmonary/Chest: Effort normal and breath sounds without rales or wheezing.                Abd:  Soft, NT, ND, + BS, no organomegaly               Neurological: Pt is alert. At baseline orientation, motor grossly intact               Skin: Skin is warm. No rashes, no other new lesions, LE edema - none               Psychiatric: Pt behavior is normal without agitation   Micro: none  Cardiac tracings I have personally interpreted today:  none  Pertinent Radiological findings (summarize): none   Lab Results  Component Value Date   WBC 4.4 09/03/2022   HGB 11.4 (L) 09/03/2022   HCT 35.1 (L) 09/03/2022   PLT  342.0 09/03/2022   GLUCOSE 96 09/03/2022   CHOL 298 (H) 09/03/2022   TRIG 99.0 09/03/2022   HDL 59.10 09/03/2022   LDLDIRECT 181.9 01/06/2010   LDLCALC 219 (H) 09/03/2022   ALT 16 09/03/2022   AST 20 09/03/2022   NA 138 09/03/2022   K 4.2 09/03/2022   CL 105 09/03/2022   CREATININE 0.99 09/03/2022   BUN 12 09/03/2022   CO2 26 09/03/2022   TSH 1.29 09/03/2022   HGBA1C 6.3 09/03/2022   MICROALBUR 0.8 09/03/2022   Assessment/Plan:  Ruth Medina is a 57 y.o. Black or African American [2] female with  has a past medical history of ANEMIA-IRON DEFICIENCY (11/29/2007), FRACTURE, HAND, LEFT (11/29/2007), FRACTURE, PELVIS (11/29/2007), HYPERLIPIDEMIA (01/06/2010), and SINUSITIS- ACUTE-NOS (10/12/2008).  Encounter for well adult exam with abnormal findings Age and sex appropriate education and counseling updated with regular exercise and diet Referrals for preventative services - has mammogram sched for next wk Immunizations addressed - declines shingrix Smoking counseling  - none needed Evidence for depression or other mood disorder - none significant Most recent labs reviewed. I have personally reviewed and have noted: 1) the patient's medical and social history 2) The patient's current medications and supplements 3) The patient's height, weight, and BMI have been recorded in the chart   Hyperlipidemia Lab Results  Component Value Date   LDLCALC 219 (H) 09/03/2022   Severe uncontrolled,, likely large genetic but also dietary component, for lower chol diet, declines statin for now, but for CT Card Score testing; if Zero can likely hold on statin start  Vitamin D deficiency Last vitamin D Lab Results  Component Value Date   VD25OH 37.28 09/03/2022   Low, to start oral replacement   ANEMIA-IRON DEFICIENCY Lab Results  Component Value Date   WBC 4.4 09/03/2022   HGB 11.4 (L) 09/03/2022   HCT 35.1 (L) 09/03/2022   MCV 67.5 (L) 09/03/2022   PLT 342.0 09/03/2022  Mild  chronic stable, continue to monitor  Followup: Return in about 1 year (around 09/18/2023).  Oliver Barre, MD 09/20/2022 7:23 AM [facility] Internal Medicine

## 2022-09-19 ENCOUNTER — Encounter: Payer: Self-pay | Admitting: Internal Medicine

## 2022-09-20 ENCOUNTER — Encounter: Payer: Self-pay | Admitting: Internal Medicine

## 2022-09-20 DIAGNOSIS — Z0001 Encounter for general adult medical examination with abnormal findings: Secondary | ICD-10-CM | POA: Insufficient documentation

## 2022-09-20 NOTE — Assessment & Plan Note (Signed)
Last vitamin D Lab Results  Component Value Date   VD25OH 37.28 09/03/2022   Low, to start oral replacement

## 2022-09-20 NOTE — Assessment & Plan Note (Signed)
Age and sex appropriate education and counseling updated with regular exercise and diet Referrals for preventative services - has mammogram sched for next wk Immunizations addressed - declines shingrix Smoking counseling  - none needed Evidence for depression or other mood disorder - none significant Most recent labs reviewed. I have personally reviewed and have noted: 1) the patient's medical and social history 2) The patient's current medications and supplements 3) The patient's height, weight, and BMI have been recorded in the chart

## 2022-09-20 NOTE — Assessment & Plan Note (Signed)
Lab Results  Component Value Date   WBC 4.4 09/03/2022   HGB 11.4 (L) 09/03/2022   HCT 35.1 (L) 09/03/2022   MCV 67.5 (L) 09/03/2022   PLT 342.0 09/03/2022   Mild chronic stable, continue to monitor

## 2022-09-20 NOTE — Assessment & Plan Note (Signed)
Lab Results  Component Value Date   LDLCALC 219 (H) 09/03/2022   Severe uncontrolled,, likely large genetic but also dietary component, for lower chol diet, declines statin for now, but for CT Card Score testing; if Zero can likely hold on statin start

## 2022-09-23 ENCOUNTER — Encounter: Payer: Self-pay | Admitting: Internal Medicine

## 2022-09-23 DIAGNOSIS — Z6829 Body mass index (BMI) 29.0-29.9, adult: Secondary | ICD-10-CM | POA: Diagnosis not present

## 2022-09-23 DIAGNOSIS — Z01411 Encounter for gynecological examination (general) (routine) with abnormal findings: Secondary | ICD-10-CM | POA: Diagnosis not present

## 2022-09-23 DIAGNOSIS — Z1231 Encounter for screening mammogram for malignant neoplasm of breast: Secondary | ICD-10-CM | POA: Diagnosis not present

## 2022-09-23 DIAGNOSIS — Z01419 Encounter for gynecological examination (general) (routine) without abnormal findings: Secondary | ICD-10-CM | POA: Diagnosis not present

## 2022-10-13 ENCOUNTER — Encounter: Payer: Self-pay | Admitting: Internal Medicine

## 2022-10-13 ENCOUNTER — Other Ambulatory Visit: Payer: Self-pay | Admitting: Internal Medicine

## 2022-10-13 ENCOUNTER — Ambulatory Visit (HOSPITAL_COMMUNITY)
Admission: RE | Admit: 2022-10-13 | Discharge: 2022-10-13 | Disposition: A | Discharge status: Home / Self Care | Payer: Self-pay | Source: Ambulatory Visit | Attending: Internal Medicine | Admitting: Internal Medicine

## 2022-10-13 DIAGNOSIS — R9431 Abnormal electrocardiogram [ECG] [EKG]: Secondary | ICD-10-CM | POA: Insufficient documentation

## 2022-10-13 DIAGNOSIS — R931 Abnormal findings on diagnostic imaging of heart and coronary circulation: Secondary | ICD-10-CM

## 2022-10-13 DIAGNOSIS — E7800 Pure hypercholesterolemia, unspecified: Secondary | ICD-10-CM | POA: Insufficient documentation

## 2022-10-13 DIAGNOSIS — R739 Hyperglycemia, unspecified: Secondary | ICD-10-CM | POA: Insufficient documentation

## 2022-10-13 MED ORDER — ROSUVASTATIN CALCIUM 20 MG PO TABS
20.0000 mg | ORAL_TABLET | Freq: Every day | ORAL | 3 refills | Status: DC
  Filled 2022-10-13: qty 90, 90d supply, fill #0

## 2022-10-13 MED ORDER — ASPIRIN 81 MG PO TBEC: 81.0000 mg | DELAYED_RELEASE_TABLET | Freq: Every day | ORAL | 12 refills | Status: AC

## 2022-10-14 ENCOUNTER — Other Ambulatory Visit: Payer: Self-pay

## 2022-10-14 ENCOUNTER — Other Ambulatory Visit (HOSPITAL_COMMUNITY): Payer: Self-pay

## 2022-10-24 NOTE — Progress Notes (Signed)
none

## 2022-11-30 NOTE — Progress Notes (Unsigned)
Cardiology Office Note:   Date:  12/02/2022  ID:  Ruth Medina, DOB 1966/05/15, MRN [id number]  History of Present Illness:   Ruth Medina is a 57 y.o. female with HLD and coronary Ca who was referred by Dr. Jonny Ruiz for elevated coronary Ca score.  Ca score in 09/2022 103 (93%) prompting referral to Cardiology for further management.  Today, the patient overall feels well. No chest pain, SOB, orthopnea, orthopnea or PND. Has been tolerating the crestor okay and feels like she is now adjusting to it. Remains active and exercises using weights 2-3x/week and walks 10,000 steps per day.   Blood pressure is usually well controlled at home mainly 120s.   Past Medical History:  Diagnosis Date   ANEMIA-IRON DEFICIENCY 11/29/2007   FRACTURE, HAND, LEFT 11/29/2007   FRACTURE, PELVIS 11/29/2007   HYPERLIPIDEMIA 01/06/2010   SINUSITIS- ACUTE-NOS 10/12/2008     ROS: As per HPI  Studies Reviewed:    EKG:  NSR, with nonspecific ST-T wave changes  Cardiac Studies & Procedures          CT SCANS  CT CARDIAC SCORING (SELF PAY ONLY) 10/17/2022  Addendum 10/17/2022  4:36 PM ADDENDUM REPORT: 10/17/2022 16:34  EXAM: OVER-READ INTERPRETATION  CT CHEST  The following report is an over-read performed by radiologist Dr. Alver Fisher [facility] on 10/17/2022. This over-read does not include interpretation of cardiac or coronary anatomy or pathology. The interpretation by the cardiologist is attached.  COMPARISON:  None.  FINDINGS: Scout view is unremarkable.  No acute extracardiac findings.  IMPRESSION: No extracardiac findings requiring follow-up.   Electronically Signed By: Leanna Battles M.D. On: 10/17/2022 16:34  Narrative CLINICAL DATA:  Risk stratification: 57 Year-old Female  EXAM: Coronary Calcium Score  TECHNIQUE: The patient was scanned on a [employer]. Axial non-contrast 3 mm slices were carried out through the heart. The data set was  analyzed on a dedicated work station and scored using the Agatson method.  FINDINGS: Non-cardiac: See separate report from [facility].  Ascending Aorta: Normal caliber. Aortic atherosclerosis.  Aortic Valve Calcium score: 0  Mitral annular calcification: 0  Pericardium: Normal.  Coronary arteries: Normal origins.  Coronary Calcium Score:  Left main: 0  Left anterior descending artery: 100  Left circumflex artery: 0  Right coronary artery: 3  Total: 103  Percentile: 93rd for age, sex, and race matched control.  IMPRESSION: 1. Coronary calcium score of 103. This was 93rd percentile for age, gender, and race matched controls.  2. Aortic atherosclerosis.  RECOMMENDATIONS:  The proposed cut-off value of 1,651 AU yielded a 93 % sensitivity and 75 % specificity in grading AS severity in patients with classical low-flow, low-gradient AS. Proposed different cut-off values to define severe AS for men and women as 2,065 AU and 1,274 AU, respectively. The joint European and American recommendations for the assessment of AS consider the aortic valve calcium score as a continuum - a very high calcium score suggests severe AS and a low calcium score suggests severe AS is unlikely.  Sunday Shams, et al. 2017 ESC/EACTS Guidelines for the management of valvular heart disease. Eur Heart J 2017;38:2739-91.  Coronary artery calcium (CAC) score is a strong predictor of incident coronary heart disease (CHD) and provides predictive information beyond traditional risk factors. CAC scoring is reasonable to use in the decision to withhold, postpone, or initiate statin therapy in intermediate-risk or selected borderline-risk asymptomatic adults (age 23-75 years and LDL-C >=70 to <  190 mg/dL) who do not have diabetes or established atherosclerotic cardiovascular disease (ASCVD).* In intermediate-risk (10-year ASCVD risk >=7.5% to <20%) adults or selected  borderline-risk (10-year ASCVD risk >=5% to <7.5%) adults in whom a CAC score is measured for the purpose of making a treatment decision the following recommendations have been made:  If CAC = 0, it is reasonable to withhold statin therapy and reassess in 5 to 10 years, as long as higher risk conditions are absent (diabetes mellitus, family history of premature CHD in first degree relatives (males <55 years; females <65 years), cigarette smoking, LDL >=190 mg/dL or other independent risk factors).  If CAC is 1 to 99, it is reasonable to initiate statin therapy for patients >=68 years of age.  If CAC is >=100 or >=75th percentile, it is reasonable to initiate statin therapy at any age.  Cardiology referral should be considered for patients with CAC scores =400 or >=75th percentile.  *2018 AHA/ACC/AACVPR/AAPA/ABC/ACPM/ADA/AGS/APhA/ASPC/NLA/PCNA Guideline on the Management of Blood Cholesterol: A Report of the American College of Cardiology/American Heart Association Task Force on Clinical Practice Guidelines. J Am Coll Cardiol. 2019;73(24):3168-3209.  Riley Lam, MD  Electronically Signed: By: Riley Lam M.D. On: 10/13/2022 15:07           Risk Assessment/Calculations:              Physical Exam:   VS:  BP 131/80   Pulse 92   Ht 5\' 11"  (1.803 m)   Wt 200 lb (90.7 kg)   LMP  (LMP Unknown)   SpO2 97%   BMI 27.89 kg/m    Wt Readings from Last 3 Encounters:  12/02/22 200 lb (90.7 kg)  09/18/22 203 lb (92.1 kg)  08/17/22 205 lb (93 kg)     GEN: Well nourished, well developed in no acute distress NECK: No JVD; No carotid bruits CARDIAC: RRR, no murmurs, rubs, gallops RESPIRATORY:  Clear to auscultation without rales, wheezing or rhonchi  ABDOMEN: Soft, non-tender, non-distended EXTREMITIES:  No edema; No deformity   ASSESSMENT AND PLAN:    #Elevated Coronary Ca: -Ca score elevated at 103 (100 in LAD and 3 in RCA) -Currently active  without anginal symptoms -Continue ASA 81mg  daily -Continue crestor 20mg  daily  #Familial HLD: -LDL 219 in 08/2022 -Continue crestor 20mg  daily -Will refer to Pharm D for consideration of PCSK9i         Signed, Meriam Sprague, MD

## 2022-12-02 ENCOUNTER — Other Ambulatory Visit: Payer: Self-pay

## 2022-12-02 ENCOUNTER — Other Ambulatory Visit (HOSPITAL_COMMUNITY): Payer: Self-pay

## 2022-12-02 ENCOUNTER — Ambulatory Visit: Payer: Commercial Managed Care - PPO | Attending: Cardiology | Admitting: Cardiology

## 2022-12-02 ENCOUNTER — Encounter: Payer: Self-pay | Admitting: Cardiology

## 2022-12-02 VITALS — BP 131/80 | HR 92 | Ht 71.0 in | Wt 200.0 lb

## 2022-12-02 DIAGNOSIS — E7801 Familial hypercholesterolemia: Secondary | ICD-10-CM | POA: Diagnosis not present

## 2022-12-02 DIAGNOSIS — I2510 Atherosclerotic heart disease of native coronary artery without angina pectoris: Secondary | ICD-10-CM | POA: Diagnosis not present

## 2022-12-02 DIAGNOSIS — I2584 Coronary atherosclerosis due to calcified coronary lesion: Secondary | ICD-10-CM

## 2022-12-02 MED ORDER — ROSUVASTATIN CALCIUM 20 MG PO TABS
20.0000 mg | ORAL_TABLET | Freq: Every day | ORAL | 3 refills | Status: DC
  Filled 2022-12-02 – 2022-12-21 (×2): qty 90, 90d supply, fill #0

## 2022-12-02 NOTE — Patient Instructions (Signed)
Medication Instructions:  Your physician recommends that you continue on your current medications as directed. Please refer to the Current Medication list given to you today.  *If you need a refill on your cardiac medications before your next appointment, please call your pharmacy*   Follow-Up: At [facility], you and your health needs are our priority.  As part of our continuing mission to provide you with exceptional heart care, we have created designated Provider Care Teams.  These Care Teams include your primary Cardiologist (physician) and Advanced Practice Providers (APPs -  Physician Assistants and Nurse Practitioners) who all work together to provide you with the care you need, when you need it.   Your next appointment:   1 year(s)  Provider:   Dr. Skains   

## 2022-12-08 ENCOUNTER — Ambulatory Visit: Payer: Commercial Managed Care - PPO | Admitting: Cardiology

## 2022-12-15 ENCOUNTER — Telehealth: Payer: Self-pay | Admitting: Cardiology

## 2022-12-15 NOTE — Telephone Encounter (Signed)
Called the pt back.  She is asking if Margaretmary Dys Pharmacist or Malena Peer Pharmacist could give her a call back to briefly discuss her upcoming referral/visit to see them for lipids, on 7/25.  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   She just briefly wants to speak with either Pharmacist to get an estimated cost of what the visit would be, as well as an estimation of treatment cost (possibly starting PCSK9-Inhibitors) under [employer].   She wants to get their input and will decide if she keeps the referral appt with them on 7/25.  Pt is aware that I will route this message to them to see if they can further call and assist her with this matter, when they have a chance.  Pt states she personally knows Aundra Millet.  Pt verbalized understanding and agrees with this plan.

## 2022-12-15 NOTE — Telephone Encounter (Signed)
Patient is requesting requesting call back to discuss upcoming appt with pharmacist and to see if appt is needed. Please advise.

## 2022-12-15 NOTE — Telephone Encounter (Signed)
Discussed purpose of lipid appt with pt. Her LDL is > 200, she requires PSCK9i therapy. She will keep appt.

## 2022-12-17 ENCOUNTER — Encounter: Payer: Self-pay | Admitting: Cardiology

## 2022-12-18 ENCOUNTER — Other Ambulatory Visit: Payer: Self-pay

## 2022-12-18 DIAGNOSIS — I2510 Atherosclerotic heart disease of native coronary artery without angina pectoris: Secondary | ICD-10-CM

## 2022-12-18 DIAGNOSIS — E785 Hyperlipidemia, unspecified: Secondary | ICD-10-CM

## 2022-12-18 DIAGNOSIS — E7801 Familial hypercholesterolemia: Secondary | ICD-10-CM

## 2022-12-21 ENCOUNTER — Other Ambulatory Visit (HOSPITAL_COMMUNITY): Payer: Self-pay

## 2022-12-21 ENCOUNTER — Other Ambulatory Visit: Payer: Self-pay

## 2022-12-21 ENCOUNTER — Ambulatory Visit: Payer: Commercial Managed Care - PPO | Attending: Cardiology

## 2022-12-21 ENCOUNTER — Other Ambulatory Visit: Payer: Self-pay | Admitting: Internal Medicine

## 2022-12-21 DIAGNOSIS — E7801 Familial hypercholesterolemia: Secondary | ICD-10-CM

## 2022-12-21 DIAGNOSIS — E785 Hyperlipidemia, unspecified: Secondary | ICD-10-CM

## 2022-12-21 DIAGNOSIS — I2510 Atherosclerotic heart disease of native coronary artery without angina pectoris: Secondary | ICD-10-CM | POA: Diagnosis not present

## 2022-12-21 DIAGNOSIS — I2584 Coronary atherosclerosis due to calcified coronary lesion: Secondary | ICD-10-CM | POA: Diagnosis not present

## 2022-12-21 MED ORDER — DICLOFENAC SODIUM 75 MG PO TBEC
DELAYED_RELEASE_TABLET | ORAL | 1 refills | Status: DC
  Filled 2022-12-21: qty 180, 90d supply, fill #0
  Filled 2023-03-17: qty 180, 90d supply, fill #1

## 2022-12-22 ENCOUNTER — Other Ambulatory Visit: Payer: Self-pay

## 2022-12-22 LAB — LIPID PANEL
Chol/HDL Ratio: 2.7 ratio (ref 0.0–4.4)
Cholesterol, Total: 163 mg/dL (ref 100–199)
HDL: 60 mg/dL (ref >39)
LDL Chol Calc (NIH): 88 mg/dL (ref 0–99)
Triglycerides: 81 mg/dL (ref 0–149)
VLDL Cholesterol Cal: 15 mg/dL (ref 5–40)

## 2022-12-22 LAB — HEPATIC FUNCTION PANEL
ALT: 17 IU/L (ref 0–32)
AST: 23 IU/L (ref 0–40)
Albumin: 4.3 g/dL (ref 3.8–4.9)
Alkaline Phosphatase: 61 IU/L (ref 44–121)
Bilirubin Total: 0.3 mg/dL (ref 0.0–1.2)
Bilirubin, Direct: <0.1 mg/dL (ref 0.00–0.40)
Total Protein: 6.9 g/dL (ref 6.0–8.5)

## 2022-12-24 ENCOUNTER — Other Ambulatory Visit (HOSPITAL_COMMUNITY): Payer: Self-pay

## 2022-12-24 ENCOUNTER — Other Ambulatory Visit: Payer: Self-pay | Admitting: Internal Medicine

## 2022-12-24 ENCOUNTER — Ambulatory Visit: Payer: Commercial Managed Care - PPO | Attending: Cardiovascular Disease | Admitting: Pharmacist

## 2022-12-24 ENCOUNTER — Other Ambulatory Visit: Payer: Self-pay

## 2022-12-24 ENCOUNTER — Telehealth: Payer: Self-pay

## 2022-12-24 ENCOUNTER — Encounter: Payer: Self-pay | Admitting: Internal Medicine

## 2022-12-24 ENCOUNTER — Telehealth: Payer: Self-pay | Admitting: Pharmacist

## 2022-12-24 DIAGNOSIS — E7800 Pure hypercholesterolemia, unspecified: Secondary | ICD-10-CM

## 2022-12-24 MED ORDER — REPATHA SURECLICK 140 MG/ML ~~LOC~~ SOAJ
1.0000 mL | SUBCUTANEOUS | 11 refills | Status: DC
  Filled 2022-12-24 (×4): qty 2, 28d supply, fill #0
  Filled 2023-01-16: qty 2, 28d supply, fill #1
  Filled 2023-02-13: qty 2, 28d supply, fill #2
  Filled 2023-03-13: qty 2, 28d supply, fill #3
  Filled 2023-04-07: qty 2, 28d supply, fill #4
  Filled 2023-05-05: qty 2, 28d supply, fill #5
  Filled 2023-06-02: qty 2, 28d supply, fill #6
  Filled 2023-06-30: qty 2, 28d supply, fill #7
  Filled 2023-07-28: qty 2, 28d supply, fill #8
  Filled 2023-08-25: qty 2, 28d supply, fill #9
  Filled 2023-09-20: qty 2, 28d supply, fill #10
  Filled 2023-10-16: qty 2, 28d supply, fill #11

## 2022-12-24 NOTE — Patient Instructions (Addendum)
Decrease rosuvastatin to 10mg  daily.  We will submit a prior authorization for Repatha. I will call you once its approved.   We will repeat lab work in 12 weeks.  Please let me know if your fatigue does not improve on lower dose rosuvastatin  You can send a mychart message or call [phone]

## 2022-12-24 NOTE — Telephone Encounter (Signed)
   NO P/A is required at this time for Repatha

## 2022-12-24 NOTE — Progress Notes (Signed)
Patient ID: Ruth Medina                 DOB: 1965/11/28                    MRN: [id number]      HPI: Ruth Medina is a 57 y.o. female patient referred to lipid clinic by Dr. Shari Prows. PMH is significant for HLD, CAC 103 (93%). Baseline LDL-C 219. Down to 88 on rosuvastatin 20mg  daily.   Patient presents today to lipid clinic.  She reports some fatigue and weakness with rosuvastatin.  Not going to the gym as frequently.  Still doing yoga or cardio at home.  Her diet is good consisting of lean proteins and plenty of vegetables.  We discussed her risk based off of her coronary calcium score.  This does place her in subclinical atherosclerosis but at a premature age therefore we would like to be aggressive and risk reduction and lower LDL cholesterol as much as possible.  Discussed that given her elevated baseline she will require 2 medications to get to less than 55.  Reviewed options for lowering LDL cholesterol, including ezetimibe, PCSK-9 inhibitors. Discussed mechanisms of action, dosing, side effects and potential decreases in LDL cholesterol.  Also reviewed cost information.   Current Medications: rosuvastatin 20mg  daily Intolerances: Rosuvastatin 20 mg (fatigue and muscle weakness) Risk Factors: Premature subclinical atherosclerosis CAC 103, LDL-C greater than 190 LDL-C goal: <55  Diet:  Breakfast: oatmeal, eggs (3 days) Lunch: salad or baked chicken and vegetable, little bit of pasta Dinner: chicken or fish w/ vegetable, some potatoes Snack: chex mix or nuts Drink: water   Exercise: cadio/weights 2 days , cardio/yoga at home  Family History: uncle CHF, mother- breast cancer, brothers- HTN  Social History: no tobacco, occasional ETOH 2 times per week (1.5-2 drinks)  Labs: Lipid Panel     Component Value Date/Time   CHOL 163 12/21/2022 0843   TRIG 81 12/21/2022 0843   HDL 60 12/21/2022 0843   CHOLHDL 2.7 12/21/2022 0843   CHOLHDL 5 09/03/2022 0828   VLDL 19.8  09/03/2022 0828   LDLCALC 88 12/21/2022 0843   LDLDIRECT 181.9 01/06/2010 0903   LABVLDL 15 12/21/2022 0843    Past Medical History:  Diagnosis Date   ANEMIA-IRON DEFICIENCY 11/29/2007   FRACTURE, HAND, LEFT 11/29/2007   FRACTURE, PELVIS 11/29/2007   HYPERLIPIDEMIA 01/06/2010   SINUSITIS- ACUTE-NOS 10/12/2008    Current Outpatient Medications on File Prior to Visit  Medication Sig Dispense Refill   aspirin EC 81 MG tablet Take 1 tablet (81 mg total) by mouth daily. Swallow whole. 30 tablet 12   clonazePAM (KLONOPIN) 0.5 MG tablet Take 1 tablet (0.5 mg total) by mouth 2 (two) times daily as needed for anxiety. 60 tablet 5   diclofenac (VOLTAREN) 75 MG EC tablet Take 1 tablet by mouth twice daily as needed 180 tablet 1   Multiple Vitamin (MULTIVITAMIN) tablet Take 1 tablet by mouth daily.     rosuvastatin (CRESTOR) 20 MG tablet Take 0.5 tablets (10 mg total) by mouth daily.     No current facility-administered medications on file prior to visit.    Allergies  Allergen Reactions   Glucosamine Forte [Nutritional Supplements] Rash    Assessment/Plan:  1. Hyperlipidemia -  Hyperlipidemia Assessment: LDL-C above goal of less than 55 on rosuvastatin 20 mg daily Patient reports increased fatigue and muscle weakness on rosuvastatin that affects her frequency to the gym Patient disappointed with knowing she  will need to be on these medications for life We did discuss the evidence for lowering LDL-C as early as possible and as low as possible Do not have an LP(a) on patient however given her calcium score, baseline LDL-C greater than 200, and age we already know we need to be as aggressive as possible therefore would most likely not change our treatment plan at this time  Plan: Will submit prior authorization for Repatha Decrease rosuvastatin to 10 mg daily to see if this helps with fatigue and weakness Repeat labs in 3 months    Thank you,  Olene Floss, Pharm.D, BCACP, BCPS,  CPP [facility] A Division of [facility] [address] [place], [place] [address]  Phone: [phone]; Fax: [phone]

## 2022-12-24 NOTE — Telephone Encounter (Signed)
No PA needed. Patient made aware. Rx sent to [facility]. Pt scheduled for f/u labs 10/14.

## 2022-12-24 NOTE — Assessment & Plan Note (Addendum)
Assessment: LDL-C above goal of less than 55 on rosuvastatin 20 mg daily Patient reports increased fatigue and muscle weakness on rosuvastatin that affects her frequency to the gym Patient disappointed with knowing she will need to be on these medications for life We did discuss the evidence for lowering LDL-C as early as possible and as low as possible Do not have an LP(a) on patient however given her calcium score, baseline LDL-C greater than 200, and age we already know we need to be as aggressive as possible therefore would most likely not change our treatment plan at this time  Plan: Will submit prior authorization for Repatha Decrease rosuvastatin to 10 mg daily to see if this helps with fatigue and weakness Repeat labs in 3 months

## 2022-12-25 ENCOUNTER — Other Ambulatory Visit (HOSPITAL_COMMUNITY): Payer: Self-pay

## 2022-12-25 ENCOUNTER — Other Ambulatory Visit: Payer: Self-pay

## 2022-12-26 ENCOUNTER — Other Ambulatory Visit (HOSPITAL_COMMUNITY): Payer: Self-pay

## 2022-12-28 ENCOUNTER — Other Ambulatory Visit (HOSPITAL_COMMUNITY): Payer: Self-pay

## 2022-12-29 ENCOUNTER — Other Ambulatory Visit (HOSPITAL_COMMUNITY): Payer: Self-pay

## 2023-01-16 ENCOUNTER — Other Ambulatory Visit: Payer: Self-pay | Admitting: Internal Medicine

## 2023-01-16 ENCOUNTER — Other Ambulatory Visit (HOSPITAL_COMMUNITY): Payer: Self-pay

## 2023-01-18 ENCOUNTER — Other Ambulatory Visit: Payer: Self-pay

## 2023-01-18 MED ORDER — ROSUVASTATIN CALCIUM 20 MG PO TABS
10.0000 mg | ORAL_TABLET | Freq: Every day | ORAL | 3 refills | Status: DC
  Filled 2023-01-18: qty 45, 90d supply, fill #0
  Filled 2023-04-13: qty 45, 90d supply, fill #1
  Filled 2023-07-12: qty 45, 90d supply, fill #2
  Filled 2023-10-10: qty 45, 90d supply, fill #3
  Filled 2024-01-05: qty 45, 90d supply, fill #4

## 2023-02-13 ENCOUNTER — Other Ambulatory Visit (HOSPITAL_COMMUNITY): Payer: Self-pay

## 2023-03-08 ENCOUNTER — Encounter: Payer: Self-pay | Admitting: Internal Medicine

## 2023-03-09 ENCOUNTER — Other Ambulatory Visit: Payer: Self-pay

## 2023-03-09 ENCOUNTER — Other Ambulatory Visit (HOSPITAL_COMMUNITY): Payer: Self-pay

## 2023-03-09 MED ORDER — CLOTRIMAZOLE-BETAMETHASONE 1-0.05 % EX CREA
1.0000 | TOPICAL_CREAM | Freq: Every day | CUTANEOUS | 1 refills | Status: DC
  Filled 2023-03-09: qty 30, 30d supply, fill #0
  Filled 2023-04-03: qty 30, 30d supply, fill #1

## 2023-03-12 ENCOUNTER — Ambulatory Visit: Payer: Commercial Managed Care - PPO | Attending: Cardiology

## 2023-03-12 DIAGNOSIS — E7800 Pure hypercholesterolemia, unspecified: Secondary | ICD-10-CM

## 2023-03-12 LAB — LIPID PANEL
Chol/HDL Ratio: 1.9 {ratio} (ref 0.0–4.4)
Cholesterol, Total: 136 mg/dL (ref 100–199)
HDL: 71 mg/dL (ref >39)
LDL Chol Calc (NIH): 45 mg/dL (ref 0–99)
Triglycerides: 110 mg/dL (ref 0–149)
VLDL Cholesterol Cal: 20 mg/dL (ref 5–40)

## 2023-03-13 ENCOUNTER — Other Ambulatory Visit (HOSPITAL_COMMUNITY): Payer: Self-pay

## 2023-03-15 ENCOUNTER — Encounter: Payer: Self-pay | Admitting: Pharmacist

## 2023-03-15 ENCOUNTER — Ambulatory Visit: Payer: Commercial Managed Care - PPO

## 2023-03-16 ENCOUNTER — Other Ambulatory Visit: Payer: Self-pay

## 2023-03-17 ENCOUNTER — Other Ambulatory Visit: Payer: Self-pay

## 2023-04-03 ENCOUNTER — Other Ambulatory Visit (HOSPITAL_COMMUNITY): Payer: Self-pay

## 2023-04-07 ENCOUNTER — Other Ambulatory Visit: Payer: Self-pay

## 2023-04-13 ENCOUNTER — Other Ambulatory Visit: Payer: Self-pay

## 2023-04-16 ENCOUNTER — Other Ambulatory Visit (HOSPITAL_BASED_OUTPATIENT_CLINIC_OR_DEPARTMENT_OTHER): Payer: Self-pay

## 2023-05-05 ENCOUNTER — Other Ambulatory Visit: Payer: Self-pay

## 2023-06-03 ENCOUNTER — Other Ambulatory Visit: Payer: Self-pay

## 2023-06-30 ENCOUNTER — Other Ambulatory Visit: Payer: Self-pay | Admitting: Internal Medicine

## 2023-07-01 ENCOUNTER — Other Ambulatory Visit: Payer: Self-pay

## 2023-07-01 MED ORDER — CLOTRIMAZOLE-BETAMETHASONE 1-0.05 % EX CREA
1.0000 | TOPICAL_CREAM | Freq: Every day | CUTANEOUS | 1 refills | Status: DC
  Filled 2023-07-01: qty 30, 30d supply, fill #0
  Filled 2023-07-28: qty 30, 30d supply, fill #1

## 2023-07-01 MED ORDER — DICLOFENAC SODIUM 75 MG PO TBEC
75.0000 mg | DELAYED_RELEASE_TABLET | Freq: Two times a day (BID) | ORAL | 1 refills | Status: DC | PRN
  Filled 2023-07-01: qty 180, 90d supply, fill #0
  Filled 2023-09-29: qty 180, 90d supply, fill #1

## 2023-07-01 MED ORDER — CLONAZEPAM 0.5 MG PO TABS
0.5000 mg | ORAL_TABLET | Freq: Two times a day (BID) | ORAL | 5 refills | Status: DC | PRN
Start: 2023-07-01 — End: 2024-03-08
  Filled 2023-07-01: qty 60, 30d supply, fill #0
  Filled 2023-09-29: qty 60, 30d supply, fill #1
  Filled 2023-12-24: qty 60, 30d supply, fill #2

## 2023-07-12 ENCOUNTER — Other Ambulatory Visit (HOSPITAL_COMMUNITY): Payer: Self-pay

## 2023-07-28 ENCOUNTER — Other Ambulatory Visit: Payer: Self-pay

## 2023-08-25 ENCOUNTER — Other Ambulatory Visit (HOSPITAL_COMMUNITY): Payer: Self-pay

## 2023-09-11 DIAGNOSIS — H524 Presbyopia: Secondary | ICD-10-CM | POA: Diagnosis not present

## 2023-09-20 ENCOUNTER — Other Ambulatory Visit (HOSPITAL_COMMUNITY): Payer: Self-pay

## 2023-09-20 ENCOUNTER — Encounter: Payer: Self-pay | Admitting: Internal Medicine

## 2023-09-20 DIAGNOSIS — E559 Vitamin D deficiency, unspecified: Secondary | ICD-10-CM

## 2023-09-20 DIAGNOSIS — E538 Deficiency of other specified B group vitamins: Secondary | ICD-10-CM

## 2023-09-20 DIAGNOSIS — D509 Iron deficiency anemia, unspecified: Secondary | ICD-10-CM

## 2023-09-20 DIAGNOSIS — E7800 Pure hypercholesterolemia, unspecified: Secondary | ICD-10-CM

## 2023-09-20 DIAGNOSIS — R739 Hyperglycemia, unspecified: Secondary | ICD-10-CM

## 2023-09-23 ENCOUNTER — Other Ambulatory Visit (HOSPITAL_COMMUNITY): Payer: Self-pay

## 2023-09-23 ENCOUNTER — Other Ambulatory Visit: Payer: Self-pay | Admitting: Internal Medicine

## 2023-09-23 ENCOUNTER — Other Ambulatory Visit (INDEPENDENT_AMBULATORY_CARE_PROVIDER_SITE_OTHER)

## 2023-09-23 ENCOUNTER — Encounter: Payer: Self-pay | Admitting: Internal Medicine

## 2023-09-23 DIAGNOSIS — E538 Deficiency of other specified B group vitamins: Secondary | ICD-10-CM

## 2023-09-23 DIAGNOSIS — R739 Hyperglycemia, unspecified: Secondary | ICD-10-CM

## 2023-09-23 DIAGNOSIS — E7800 Pure hypercholesterolemia, unspecified: Secondary | ICD-10-CM

## 2023-09-23 DIAGNOSIS — E559 Vitamin D deficiency, unspecified: Secondary | ICD-10-CM | POA: Diagnosis not present

## 2023-09-23 DIAGNOSIS — D509 Iron deficiency anemia, unspecified: Secondary | ICD-10-CM

## 2023-09-23 LAB — URINALYSIS, ROUTINE W REFLEX MICROSCOPIC
Bilirubin Urine: NEGATIVE
Hgb urine dipstick: NEGATIVE
Ketones, ur: NEGATIVE
Nitrite: POSITIVE — AB
Specific Gravity, Urine: 1.015 (ref 1.000–1.030)
Total Protein, Urine: NEGATIVE
Urine Glucose: NEGATIVE
Urobilinogen, UA: 0.2 (ref 0.0–1.0)
pH: 6 (ref 5.0–8.0)

## 2023-09-23 LAB — CBC WITH DIFFERENTIAL/PLATELET
Basophils Absolute: 0.1 10*3/uL (ref 0.0–0.1)
Basophils Relative: 1.4 % (ref 0.0–3.0)
Eosinophils Absolute: 0.5 10*3/uL (ref 0.0–0.7)
Eosinophils Relative: 11.9 % — ABNORMAL HIGH (ref 0.0–5.0)
HCT: 36.9 % (ref 36.0–46.0)
Hemoglobin: 11.5 g/dL — ABNORMAL LOW (ref 12.0–15.0)
Lymphocytes Relative: 33.7 % (ref 12.0–46.0)
Lymphs Abs: 1.5 10*3/uL (ref 0.7–4.0)
MCHC: 31.2 g/dL (ref 30.0–36.0)
MCV: 70.3 fl — ABNORMAL LOW (ref 78.0–100.0)
Monocytes Absolute: 0.3 10*3/uL (ref 0.1–1.0)
Monocytes Relative: 6.9 % (ref 3.0–12.0)
Neutro Abs: 2 10*3/uL (ref 1.4–7.7)
Neutrophils Relative %: 46.1 % (ref 43.0–77.0)
Platelets: 272 10*3/uL (ref 150.0–400.0)
RBC: 5.25 Mil/uL — ABNORMAL HIGH (ref 3.87–5.11)
RDW: 15.5 % (ref 11.5–15.5)
WBC: 4.3 10*3/uL (ref 4.0–10.5)

## 2023-09-23 LAB — IBC PANEL
Iron: 76 ug/dL (ref 42–145)
Saturation Ratios: 20 % (ref 20.0–50.0)
TIBC: 379.4 ug/dL (ref 250.0–450.0)
Transferrin: 271 mg/dL (ref 212.0–360.0)

## 2023-09-23 LAB — MICROALBUMIN / CREATININE URINE RATIO
Creatinine,U: 100.2 mg/dL
Microalb Creat Ratio: 10.8 mg/g (ref 0.0–30.0)
Microalb, Ur: 1.1 mg/dL (ref 0.0–1.9)

## 2023-09-23 LAB — LIPID PANEL
Cholesterol: 133 mg/dL (ref 0–200)
HDL: 64.4 mg/dL (ref >39.00)
LDL Cholesterol: 52 mg/dL (ref 0–99)
NonHDL: 68.66
Total CHOL/HDL Ratio: 2
Triglycerides: 83 mg/dL (ref 0.0–149.0)
VLDL: 16.6 mg/dL (ref 0.0–40.0)

## 2023-09-23 LAB — BASIC METABOLIC PANEL WITH GFR
BUN: 14 mg/dL (ref 6–23)
CO2: 27 meq/L (ref 19–32)
Calcium: 9.2 mg/dL (ref 8.4–10.5)
Chloride: 104 meq/L (ref 96–112)
Creatinine, Ser: 0.92 mg/dL (ref 0.40–1.20)
GFR: 68.12 mL/min (ref >60.00)
Glucose, Bld: 87 mg/dL (ref 70–99)
Potassium: 4.1 meq/L (ref 3.5–5.1)
Sodium: 138 meq/L (ref 135–145)

## 2023-09-23 LAB — HEPATIC FUNCTION PANEL
ALT: 16 U/L (ref 0–35)
AST: 19 U/L (ref 0–37)
Albumin: 4.2 g/dL (ref 3.5–5.2)
Alkaline Phosphatase: 54 U/L (ref 39–117)
Bilirubin, Direct: 0.1 mg/dL (ref 0.0–0.3)
Total Bilirubin: 0.5 mg/dL (ref 0.2–1.2)
Total Protein: 6.8 g/dL (ref 6.0–8.3)

## 2023-09-23 LAB — HEMOGLOBIN A1C: Hgb A1c MFr Bld: 6.4 % (ref 4.6–6.5)

## 2023-09-23 MED ORDER — CEPHALEXIN 500 MG PO CAPS
500.0000 mg | ORAL_CAPSULE | Freq: Three times a day (TID) | ORAL | 0 refills | Status: DC
  Filled 2023-09-23: qty 30, 10d supply, fill #0

## 2023-09-24 LAB — TSH: TSH: 0.99 u[IU]/mL (ref 0.35–5.50)

## 2023-09-24 LAB — VITAMIN B12: Vitamin B-12: 700 pg/mL (ref 211–911)

## 2023-09-24 LAB — FERRITIN: Ferritin: 38.3 ng/mL (ref 10.0–291.0)

## 2023-09-24 LAB — VITAMIN D 25 HYDROXY (VIT D DEFICIENCY, FRACTURES): VITD: 29.9 ng/mL — ABNORMAL LOW (ref 30.00–100.00)

## 2023-09-27 ENCOUNTER — Ambulatory Visit (INDEPENDENT_AMBULATORY_CARE_PROVIDER_SITE_OTHER): Admitting: Internal Medicine

## 2023-09-27 ENCOUNTER — Encounter: Payer: Self-pay | Admitting: Internal Medicine

## 2023-09-27 VITALS — BP 120/78 | HR 63 | Temp 98.4°F | Ht 71.0 in | Wt 199.0 lb

## 2023-09-27 DIAGNOSIS — M1712 Unilateral primary osteoarthritis, left knee: Secondary | ICD-10-CM

## 2023-09-27 DIAGNOSIS — E559 Vitamin D deficiency, unspecified: Secondary | ICD-10-CM | POA: Diagnosis not present

## 2023-09-27 DIAGNOSIS — Z0001 Encounter for general adult medical examination with abnormal findings: Secondary | ICD-10-CM

## 2023-09-27 DIAGNOSIS — N390 Urinary tract infection, site not specified: Secondary | ICD-10-CM | POA: Diagnosis not present

## 2023-09-27 DIAGNOSIS — R739 Hyperglycemia, unspecified: Secondary | ICD-10-CM | POA: Diagnosis not present

## 2023-09-27 DIAGNOSIS — E7800 Pure hypercholesterolemia, unspecified: Secondary | ICD-10-CM | POA: Diagnosis not present

## 2023-09-27 DIAGNOSIS — E538 Deficiency of other specified B group vitamins: Secondary | ICD-10-CM | POA: Diagnosis not present

## 2023-09-27 NOTE — Progress Notes (Unsigned)
 Patient ID: Ruth Medina, female   DOB: 06-Mar-1966, 58 y.o.   MRN: [id number]         Chief Complaint:: wellness exam and UTI dysuria, low vit d, hld , left knee arthritis pain worsening       HPI:  Ruth Medina is a 58 y.o. female here for wellness exam; declines pneumovax, o/w up to date                        Also with 3 days onset dysuria prior to recent UA, now improved with cephalexin .  Denies urinary symptoms such as dysuria, frequency, urgency, flank pain, hematuria or n/v, fever, chills.  Also left knee arthritis getting worse to mod to severe, believes she now needs to see ortho   Pt denies chest pain, increased sob or doe, wheezing, orthopnea, PND, increased LE swelling, palpitations, dizziness or syncope.   Pt denies polydipsia, polyuria, or new focal neuro s/s.    Pt denies fever, wt loss, night sweats, loss of appetite, or other constitutional symptoms     Wt Readings from Last 3 Encounters:  09/27/23 199 lb (90.3 kg)  12/02/22 200 lb (90.7 kg)  09/18/22 203 lb (92.1 kg)   BP Readings from Last 3 Encounters:  09/27/23 120/78  12/02/22 131/80  09/18/22 122/76   Immunization History  Administered Date(s) Administered   Influenza,inj,Quad PF,6+ Mos 03/02/2019, 03/23/2021   Influenza-Unspecified 03/02/2016, 03/07/2018, 03/01/2020, 02/21/2022, 02/26/2023   PFIZER(Purple Top)SARS-COV-2 Vaccination 06/02/2019, 06/16/2019, 07/07/2019, 01/31/2020, 02/19/2021   Pfizer(Comirnaty)Fall Seasonal Vaccine 12 years and older 02/16/2022   Td 06/01/2000, 08/17/2022   Tdap 01/30/2015   Zoster Recombinant(Shingrix) 09/18/2022, 10/19/2022   Health Maintenance Due  Topic Date Due   Cervical Cancer Screening (HPV/Pap Cotest)  05/01/2012      Past Medical History:  Diagnosis Date   ANEMIA-IRON DEFICIENCY 11/29/2007   FRACTURE, HAND, LEFT 11/29/2007   FRACTURE, PELVIS 11/29/2007   HYPERLIPIDEMIA 01/06/2010   SINUSITIS- ACUTE-NOS 10/12/2008   Past Surgical History:  Procedure  Laterality Date   aspiration of left knee     Left hand surgery     Dr. Abigail Abler    reports that she has never smoked. She has never used smokeless tobacco. She reports that she does not drink alcohol and does not use drugs. family history includes Alcohol abuse in her father; Breast cancer in her mother; Cancer in her mother; Hypertension in her brother, brother, father, and mother. Allergies  Allergen Reactions   Glucosamine Forte [Nutritional Supplements] Rash   Current Outpatient Medications on File Prior to Visit  Medication Sig Dispense Refill   aspirin  EC 81 MG tablet Take 1 tablet (81 mg total) by mouth daily. Swallow whole. 30 tablet 12   cephALEXin  (KEFLEX ) 500 MG capsule Take 1 capsule (500 mg total) by mouth 3 (three) times daily. 30 capsule 0   clonazePAM  (KLONOPIN ) 0.5 MG tablet Take 1 tablet (0.5 mg total) by mouth 2 (two) times daily as needed for anxiety. 60 tablet 5   clotrimazole -betamethasone  (LOTRISONE ) cream Apply 1 Application topically daily. 30 g 1   diclofenac  (VOLTAREN ) 75 MG EC tablet Take 1 tablet (75 mg total) by mouth 2 (two) times daily as needed. 180 tablet 1   Evolocumab  (REPATHA  SURECLICK) 140 MG/ML SOAJ Inject 140 mg into the skin every 14 (fourteen) days. 2 mL 11   Multiple Vitamin (MULTIVITAMIN) tablet Take 1 tablet by mouth daily.     rosuvastatin  (CRESTOR ) 20 MG tablet  Take 0.5 tablets (10 mg total) by mouth daily. 90 tablet 3   No current facility-administered medications on file prior to visit.        ROS:  All others reviewed and negative.  Objective        PE:  BP 120/78 (BP Location: Right Arm, Patient Position: Sitting, Cuff Size: Normal)   Pulse 63   Temp 98.4 F (36.9 C) (Oral)   Ht 5\' 11"  (1.803 m)   Wt 199 lb (90.3 kg)   LMP  (LMP Unknown)   SpO2 99%   BMI 27.75 kg/m                 Constitutional: Pt appears in NAD               HENT: Head: NCAT.                Right Ear: External ear normal.                 Left Ear: External  ear normal.                Eyes: . Pupils are equal, round, and reactive to light. Conjunctivae and EOM are normal               Nose: without d/c or deformity               Neck: Neck supple. Gross normal ROM               Cardiovascular: Normal rate and regular rhythm.                 Pulmonary/Chest: Effort normal and breath sounds without rales or wheezing.                Abd:  Soft, NT, ND, + BS, no organomegaly               Neurological: Pt is alert. At baseline orientation, motor grossly intact               Skin: Skin is warm. No rashes, no other new lesions, LE edema - none               Psychiatric: Pt behavior is normal without agitation   Micro: none  Cardiac tracings I have personally interpreted today:  none  Pertinent Radiological findings (summarize): none   Lab Results  Component Value Date   WBC 4.3 09/23/2023   HGB 11.5 (L) 09/23/2023   HCT 36.9 09/23/2023   PLT 272.0 09/23/2023   GLUCOSE 87 09/23/2023   CHOL 133 09/23/2023   TRIG 83.0 09/23/2023   HDL 64.40 09/23/2023   LDLDIRECT 181.9 01/06/2010   LDLCALC 52 09/23/2023   ALT 16 09/23/2023   AST 19 09/23/2023   NA 138 09/23/2023   K 4.1 09/23/2023   CL 104 09/23/2023   CREATININE 0.92 09/23/2023   BUN 14 09/23/2023   CO2 27 09/23/2023   TSH 0.99 09/23/2023   HGBA1C 6.4 09/23/2023   MICROALBUR 1.1 09/23/2023   Assessment/Plan:  Ruth Medina is a 58 y.o. Black or African American [2] female with  has a past medical history of ANEMIA-IRON DEFICIENCY (11/29/2007), FRACTURE, HAND, LEFT (11/29/2007), FRACTURE, PELVIS (11/29/2007), HYPERLIPIDEMIA (01/06/2010), and SINUSITIS- ACUTE-NOS (10/12/2008).  Encounter for well adult exam with abnormal findings Age and sex appropriate education and counseling updated with regular exercise and diet Referrals for preventative services - none needed Immunizations addressed -  declines pneumovax  Smoking counseling  - none needed Evidence for depression or other mood  disorder - none significant Most recent labs reviewed. I have personally reviewed and have noted: 1) the patient's medical and social history 2) The patient's current medications and supplements 3) The patient's height, weight, and BMI have been recorded in the chart   Hyperlipidemia Lab Results  Component Value Date   LDLCALC 52 09/23/2023   Stable, pt to continue current statin crestor  10 every day, repatha  140 mg   Vitamin D  deficiency Last vitamin D  Lab Results  Component Value Date   VD25OH 29.90 (L) 09/23/2023   Low, to start oral replacement   Arthritis of left knee Worsening symptomatically, for ortho referral  UTI (urinary tract infection) Improving, pt to finish cephalexin  course  Followup: Return in about 1 year (around 09/26/2024).  Rosalia Colonel, MD 09/28/2023 7:29 PM [facility]

## 2023-09-27 NOTE — Patient Instructions (Signed)
 Ok to finish the antibioic as you have  Please take OTC Vitamin D3 at 2000 units per day, indefinitely  Please continue all other medications as before, and refills have been done if requested.  Please have the pharmacy call with any other refills you may need.  Please continue your efforts at being more active, low cholesterol diet, and weight control.  You are otherwise up to date with prevention measures today.  Please keep your appointments with your specialists as you may have planned  You will be contacted regarding the referral for: Dr France [facility]  Please make an Appointment to return for your 1 year visit, or sooner if needed, with Lab testing by Appointment as well, to be done about 3-5 days before at the FIRST FLOOR Lab (so this is for TWO appointments - please see the scheduling desk as you leave)

## 2023-09-28 ENCOUNTER — Encounter: Payer: Self-pay | Admitting: Internal Medicine

## 2023-09-28 DIAGNOSIS — M1712 Unilateral primary osteoarthritis, left knee: Secondary | ICD-10-CM | POA: Insufficient documentation

## 2023-09-28 DIAGNOSIS — N390 Urinary tract infection, site not specified: Secondary | ICD-10-CM | POA: Insufficient documentation

## 2023-09-28 NOTE — Addendum Note (Signed)
 Addended by: Roslyn Coombe on: 09/28/2023 07:34 PM   Modules accepted: Orders

## 2023-09-28 NOTE — Assessment & Plan Note (Signed)
 Worsening symptomatically, for ortho referral

## 2023-09-28 NOTE — Assessment & Plan Note (Signed)
 Lab Results  Component Value Date   LDLCALC 52 09/23/2023   Stable, pt to continue current statin crestor  10 every day, repatha  140 mg

## 2023-09-28 NOTE — Assessment & Plan Note (Signed)
 Improving, pt to finish cephalexin  course

## 2023-09-28 NOTE — Assessment & Plan Note (Signed)
 Last vitamin D  Lab Results  Component Value Date   VD25OH 29.90 (L) 09/23/2023   Low, to start oral replacement

## 2023-09-28 NOTE — Assessment & Plan Note (Signed)
Age and sex appropriate education and counseling updated with regular exercise and diet Referrals for preventative services - none needed Immunizations addressed - declines pneumovax Smoking counseling  - none needed Evidence for depression or other mood disorder - none significant Most recent labs reviewed. I have personally reviewed and have noted: 1) the patient's medical and social history 2) The patient's current medications and supplements 3) The patient's height, weight, and BMI have been recorded in the chart 

## 2023-09-29 ENCOUNTER — Other Ambulatory Visit (HOSPITAL_COMMUNITY): Payer: Self-pay

## 2023-09-29 ENCOUNTER — Other Ambulatory Visit: Payer: Self-pay

## 2023-09-29 ENCOUNTER — Other Ambulatory Visit: Payer: Self-pay | Admitting: Internal Medicine

## 2023-09-29 MED ORDER — CLOTRIMAZOLE-BETAMETHASONE 1-0.05 % EX CREA
1.0000 | TOPICAL_CREAM | Freq: Every day | CUTANEOUS | 1 refills | Status: AC
  Filled 2023-09-29: qty 30, 30d supply, fill #0
  Filled 2023-10-25: qty 30, 30d supply, fill #1

## 2023-10-10 ENCOUNTER — Other Ambulatory Visit (HOSPITAL_COMMUNITY): Payer: Self-pay

## 2023-10-18 ENCOUNTER — Other Ambulatory Visit (HOSPITAL_COMMUNITY): Payer: Self-pay

## 2023-10-20 DIAGNOSIS — Z01419 Encounter for gynecological examination (general) (routine) without abnormal findings: Secondary | ICD-10-CM | POA: Diagnosis not present

## 2023-10-20 DIAGNOSIS — N898 Other specified noninflammatory disorders of vagina: Secondary | ICD-10-CM | POA: Diagnosis not present

## 2023-10-20 DIAGNOSIS — Z1231 Encounter for screening mammogram for malignant neoplasm of breast: Secondary | ICD-10-CM | POA: Diagnosis not present

## 2023-10-25 ENCOUNTER — Other Ambulatory Visit (HOSPITAL_COMMUNITY): Payer: Self-pay

## 2023-11-11 ENCOUNTER — Other Ambulatory Visit (HOSPITAL_COMMUNITY): Payer: Self-pay

## 2023-11-11 ENCOUNTER — Other Ambulatory Visit: Payer: Self-pay

## 2023-11-11 MED ORDER — CLOBETASOL PROPIONATE E 0.05 % EX CREA
TOPICAL_CREAM | Freq: Two times a day (BID) | CUTANEOUS | 0 refills | Status: AC
  Filled 2023-11-11: qty 15, 30d supply, fill #0

## 2023-11-12 ENCOUNTER — Other Ambulatory Visit: Payer: Self-pay

## 2023-11-19 ENCOUNTER — Other Ambulatory Visit: Payer: Self-pay

## 2023-11-22 ENCOUNTER — Other Ambulatory Visit (HOSPITAL_COMMUNITY): Payer: Self-pay

## 2023-11-22 ENCOUNTER — Other Ambulatory Visit: Payer: Self-pay | Admitting: Internal Medicine

## 2023-11-22 ENCOUNTER — Other Ambulatory Visit: Payer: Self-pay

## 2023-11-22 ENCOUNTER — Telehealth: Payer: Self-pay | Admitting: Cardiology

## 2023-11-22 MED ORDER — REPATHA SURECLICK 140 MG/ML ~~LOC~~ SOAJ
1.0000 mL | SUBCUTANEOUS | 0 refills | Status: DC
Start: 2023-11-22 — End: 2024-03-08
  Filled 2023-11-22 – 2023-12-15 (×2): qty 6, 84d supply, fill #0

## 2023-11-22 MED ORDER — REPATHA SURECLICK 140 MG/ML ~~LOC~~ SOAJ
1.0000 mL | SUBCUTANEOUS | 11 refills | Status: DC
  Filled 2023-11-22: qty 2, 28d supply, fill #0

## 2023-11-22 NOTE — Telephone Encounter (Signed)
*  STAT* If patient is at the pharmacy, call can be transferred to refill team.   1. Which medications need to be refilled? (please list name of each medication and dose if known)  Evolocumab  (REPATHA  SURECLICK) 140 MG/ML SOAJ  2. Which pharmacy/location (including street and city if local pharmacy) is medication to be sent to  [facility]  3. Do they need a 30 day or 90 day supply? 90

## 2023-11-23 ENCOUNTER — Other Ambulatory Visit (HOSPITAL_COMMUNITY): Payer: Self-pay

## 2023-11-23 ENCOUNTER — Other Ambulatory Visit: Payer: Self-pay

## 2023-12-15 ENCOUNTER — Other Ambulatory Visit (HOSPITAL_COMMUNITY): Payer: Self-pay

## 2023-12-17 DIAGNOSIS — M1712 Unilateral primary osteoarthritis, left knee: Secondary | ICD-10-CM | POA: Diagnosis not present

## 2023-12-20 ENCOUNTER — Telehealth: Payer: Self-pay

## 2023-12-20 NOTE — Telephone Encounter (Signed)
   Pre-operative Risk Assessment    Patient Name: Ruth Medina  DOB: 1965/09/26 MRN: [id number]   Date of last office visit: 12/02/22 POWELL SORROW, MD Date of next office visit: 01/07/24 ONEIL PARCHMENT, MD   Request for Surgical Clearance    Procedure:  LEFT TOTAL KNEE ARTHROPLASTY  Date of Surgery:  Clearance 02/09/24                                Surgeon:  DR DEMPSEY MOAN Surgeon's Group or Practice Name:  [facility] Phone number:  [phone] Fax number:  [phone]   Type of Clearance Requested:   - Medical  - Pharmacy:  Hold Aspirin      Type of Anesthesia:  CHOICE   Additional requests/questions:    SignedLucie DELENA Ku   12/20/2023, 4:28 PM

## 2023-12-21 NOTE — Telephone Encounter (Signed)
 Primary Cardiologist:Mark Jeffrie, MD  Chart reviewed as part of pre-operative protocol coverage. Because of Ruth Medina's past medical history and time since last visit, he/she will require a follow-up visit in order to better assess preoperative cardiovascular risk.  Pre-op covering staff: - Patient has an appointment with Dr. Jeffrie on 01/07/24 at which time clearance will be addressed. Appointment notes have been updated. - Please contact requesting surgeon's office via preferred method (i.e, phone, fax) to inform them of need for appointment prior to surgery.  Per office protocol, and pending no concerning cardiac symptoms at the time of her visit, she may hold aspirin  for 5-7 days prior to procedure and should resume as soon as hemodynamically stable postoperatively.  Ruth EMERSON Bane, NP-C  12/21/2023, 11:29 AM [address] [place], [place] [address] Office [phone] Fax [phone]

## 2023-12-21 NOTE — Telephone Encounter (Signed)
 Patient has an upcoming appointment clearance can be addressed at ov will make a note on appointment line that clearance is needed

## 2023-12-24 ENCOUNTER — Other Ambulatory Visit (HOSPITAL_COMMUNITY): Payer: Self-pay

## 2023-12-24 ENCOUNTER — Other Ambulatory Visit: Payer: Self-pay

## 2024-01-05 ENCOUNTER — Other Ambulatory Visit (HOSPITAL_COMMUNITY): Payer: Self-pay

## 2024-01-07 ENCOUNTER — Ambulatory Visit: Attending: Cardiology | Admitting: Cardiology

## 2024-01-07 ENCOUNTER — Encounter: Payer: Self-pay | Admitting: Cardiology

## 2024-01-07 VITALS — BP 149/92 | HR 81 | Ht 71.0 in | Wt 200.0 lb

## 2024-01-07 DIAGNOSIS — Z01818 Encounter for other preprocedural examination: Secondary | ICD-10-CM

## 2024-01-07 DIAGNOSIS — E7801 Familial hypercholesterolemia: Secondary | ICD-10-CM

## 2024-01-07 DIAGNOSIS — I2584 Coronary atherosclerosis due to calcified coronary lesion: Secondary | ICD-10-CM

## 2024-01-07 DIAGNOSIS — I2510 Atherosclerotic heart disease of native coronary artery without angina pectoris: Secondary | ICD-10-CM | POA: Diagnosis not present

## 2024-01-07 NOTE — Patient Instructions (Signed)
 Medication Instructions:  Your physician recommends that you continue on your current medications as directed. Please refer to the Current Medication list given to you today.  *If you need a refill on your cardiac medications before your next appointment, please call your pharmacy*  Lab Work: NONE If you have labs (blood work) drawn today and your tests are completely normal, you will receive your results only by: MyChart Message (if you have MyChart) OR A paper copy in the mail If you have any lab test that is abnormal or we need to change your treatment, we will call you to review the results.  Testing/Procedures: NONE  Follow-Up: At [facility], you and your health needs are our priority.  As part of our continuing mission to provide you with exceptional heart care, our providers are all part of one team.  This team includes your primary Cardiologist (physician) and Advanced Practice Providers or APPs (Physician Assistants and Nurse Practitioners) who all work together to provide you with the care you need, when you need it.  Your next appointment:   1 year(s)  Provider:   Dorothye Gathers, MD   We recommend signing up for the patient portal called "MyChart".  Sign up information is provided on this After Visit Summary.  MyChart is used to connect with patients for Virtual Visits (Telemedicine).  Patients are able to view lab/test results, encounter notes, upcoming appointments, etc.  Non-urgent messages can be sent to your provider as well.   To learn more about what you can do with MyChart, go to [email or web address].
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
## 2024-01-28 DIAGNOSIS — M1712 Unilateral primary osteoarthritis, left knee: Secondary | ICD-10-CM | POA: Diagnosis not present

## 2024-01-28 DIAGNOSIS — M25562 Pain in left knee: Secondary | ICD-10-CM | POA: Diagnosis not present

## 2024-02-04 ENCOUNTER — Other Ambulatory Visit (HOSPITAL_COMMUNITY): Payer: Self-pay

## 2024-02-04 MED ORDER — OXYCODONE HCL 5 MG PO TABS
5.0000 mg | ORAL_TABLET | Freq: Four times a day (QID) | ORAL | 0 refills | Status: DC | PRN
  Filled 2024-02-04: qty 42, 6d supply, fill #0

## 2024-02-04 MED ORDER — GABAPENTIN 300 MG PO CAPS
ORAL_CAPSULE | ORAL | 0 refills | Status: AC
  Filled 2024-02-04 (×2): qty 84, 42d supply, fill #0

## 2024-02-04 MED ORDER — METHOCARBAMOL 500 MG PO TABS
500.0000 mg | ORAL_TABLET | Freq: Four times a day (QID) | ORAL | 0 refills | Status: AC | PRN
  Filled 2024-02-04 (×2): qty 40, 10d supply, fill #0

## 2024-02-04 MED ORDER — ASPIRIN 81 MG PO TBEC
DELAYED_RELEASE_TABLET | ORAL | 0 refills | Status: AC
  Filled 2024-02-04 (×2): qty 63, 42d supply, fill #0

## 2024-02-04 MED ORDER — TRAMADOL HCL 50 MG PO TABS
50.0000 mg | ORAL_TABLET | Freq: Four times a day (QID) | ORAL | 0 refills | Status: DC | PRN
  Filled 2024-02-04: qty 40, 5d supply, fill #0

## 2024-02-04 MED ORDER — ONDANSETRON HCL 4 MG PO TABS
4.0000 mg | ORAL_TABLET | Freq: Four times a day (QID) | ORAL | 0 refills | Status: AC | PRN
  Filled 2024-02-04: qty 20, 5d supply, fill #0

## 2024-02-09 DIAGNOSIS — M25762 Osteophyte, left knee: Secondary | ICD-10-CM | POA: Diagnosis not present

## 2024-02-09 DIAGNOSIS — M1712 Unilateral primary osteoarthritis, left knee: Secondary | ICD-10-CM | POA: Diagnosis not present

## 2024-02-14 DIAGNOSIS — M25562 Pain in left knee: Secondary | ICD-10-CM | POA: Diagnosis not present

## 2024-02-16 DIAGNOSIS — M25562 Pain in left knee: Secondary | ICD-10-CM | POA: Diagnosis not present

## 2024-02-18 DIAGNOSIS — M25562 Pain in left knee: Secondary | ICD-10-CM | POA: Diagnosis not present

## 2024-02-21 ENCOUNTER — Other Ambulatory Visit (HOSPITAL_COMMUNITY): Payer: Self-pay

## 2024-02-21 ENCOUNTER — Other Ambulatory Visit: Payer: Self-pay

## 2024-02-21 MED ORDER — OXYCODONE HCL 5 MG PO TABS
5.0000 mg | ORAL_TABLET | Freq: Three times a day (TID) | ORAL | 0 refills | Status: AC | PRN
  Filled 2024-02-21 – 2024-02-22 (×2): qty 42, 7d supply, fill #0

## 2024-02-21 MED ORDER — TRAMADOL HCL 50 MG PO TABS
50.0000 mg | ORAL_TABLET | Freq: Four times a day (QID) | ORAL | 0 refills | Status: DC | PRN
  Filled 2024-02-21 – 2024-02-22 (×2): qty 40, 5d supply, fill #0

## 2024-02-22 ENCOUNTER — Other Ambulatory Visit: Payer: Self-pay

## 2024-02-22 ENCOUNTER — Other Ambulatory Visit (HOSPITAL_COMMUNITY): Payer: Self-pay

## 2024-03-08 ENCOUNTER — Other Ambulatory Visit (HOSPITAL_COMMUNITY): Payer: Self-pay

## 2024-03-08 ENCOUNTER — Other Ambulatory Visit: Payer: Self-pay | Admitting: Cardiology

## 2024-03-08 ENCOUNTER — Other Ambulatory Visit: Payer: Self-pay

## 2024-03-08 ENCOUNTER — Other Ambulatory Visit: Payer: Self-pay | Admitting: Internal Medicine

## 2024-03-08 MED ORDER — REPATHA SURECLICK 140 MG/ML ~~LOC~~ SOAJ
1.0000 mL | SUBCUTANEOUS | 2 refills | Status: AC
  Filled 2024-03-08: qty 6, 84d supply, fill #0
  Filled 2024-05-30: qty 6, 84d supply, fill #1

## 2024-03-08 MED ORDER — DICLOFENAC SODIUM 75 MG PO TBEC
75.0000 mg | DELAYED_RELEASE_TABLET | Freq: Two times a day (BID) | ORAL | 1 refills | Status: AC | PRN
  Filled 2024-03-08: qty 180, 90d supply, fill #0
  Filled 2024-06-15: qty 180, 90d supply, fill #1

## 2024-03-08 MED ORDER — CLONAZEPAM 0.5 MG PO TABS
0.5000 mg | ORAL_TABLET | Freq: Two times a day (BID) | ORAL | 0 refills | Status: DC | PRN
  Filled 2024-03-08: qty 30, 15d supply, fill #0

## 2024-03-08 MED ORDER — TRAMADOL HCL 50 MG PO TABS
50.0000 mg | ORAL_TABLET | Freq: Four times a day (QID) | ORAL | 0 refills | Status: DC | PRN
  Filled 2024-03-08 (×2): qty 40, 5d supply, fill #0

## 2024-03-14 ENCOUNTER — Other Ambulatory Visit (HOSPITAL_COMMUNITY): Payer: Self-pay

## 2024-03-14 MED ORDER — TRAMADOL HCL 50 MG PO TABS
50.0000 mg | ORAL_TABLET | Freq: Every day | ORAL | 0 refills | Status: AC | PRN
  Filled 2024-03-14: qty 30, 15d supply, fill #0

## 2024-03-15 ENCOUNTER — Other Ambulatory Visit: Payer: Self-pay

## 2024-04-06 ENCOUNTER — Encounter: Payer: Self-pay | Admitting: Internal Medicine

## 2024-04-14 ENCOUNTER — Other Ambulatory Visit: Payer: Self-pay | Admitting: Family Medicine

## 2024-04-17 ENCOUNTER — Other Ambulatory Visit (HOSPITAL_COMMUNITY): Payer: Self-pay

## 2024-04-17 ENCOUNTER — Other Ambulatory Visit: Payer: Self-pay

## 2024-04-17 MED ORDER — CLONAZEPAM 0.5 MG PO TABS
0.5000 mg | ORAL_TABLET | Freq: Two times a day (BID) | ORAL | 0 refills | Status: DC | PRN
  Filled 2024-04-17: qty 30, 15d supply, fill #0

## 2024-05-22 ENCOUNTER — Other Ambulatory Visit: Payer: Self-pay | Admitting: Internal Medicine

## 2024-05-23 ENCOUNTER — Other Ambulatory Visit: Payer: Self-pay

## 2024-05-23 ENCOUNTER — Other Ambulatory Visit (HOSPITAL_COMMUNITY): Payer: Self-pay

## 2024-05-23 MED ORDER — ROSUVASTATIN CALCIUM 20 MG PO TABS
10.0000 mg | ORAL_TABLET | Freq: Every day | ORAL | 3 refills | Status: AC
  Filled 2024-05-23: qty 45, 90d supply, fill #0

## 2024-05-23 MED ORDER — CLONAZEPAM 0.5 MG PO TABS
0.5000 mg | ORAL_TABLET | Freq: Two times a day (BID) | ORAL | 2 refills | Status: AC | PRN
  Filled 2024-05-23: qty 30, 15d supply, fill #0
  Filled 2024-06-15: qty 30, 15d supply, fill #1

## 2024-05-30 ENCOUNTER — Other Ambulatory Visit (HOSPITAL_COMMUNITY): Payer: Self-pay

## 2024-06-15 ENCOUNTER — Other Ambulatory Visit: Payer: Self-pay

## 2024-07-26 ENCOUNTER — Encounter: Admitting: Family Medicine
